# Patient Record
Sex: Male | Born: 1980 | Race: White | Hispanic: No | Marital: Married | Smoking: Former smoker
Health system: Southern US, Community
[De-identification: ages and names within clinical notes are randomized; demographics above are authoritative.]

## PROBLEM LIST (undated history)

## (undated) DIAGNOSIS — F909 Attention-deficit hyperactivity disorder, unspecified type: Secondary | ICD-10-CM

## (undated) DIAGNOSIS — F419 Anxiety disorder, unspecified: Secondary | ICD-10-CM

## (undated) DIAGNOSIS — K219 Gastro-esophageal reflux disease without esophagitis: Secondary | ICD-10-CM

## (undated) DIAGNOSIS — F32A Depression, unspecified: Secondary | ICD-10-CM

## (undated) DIAGNOSIS — Z9109 Other allergy status, other than to drugs and biological substances: Secondary | ICD-10-CM

## (undated) HISTORY — DX: Gastro-esophageal reflux disease without esophagitis: K21.9

## (undated) HISTORY — DX: Depression, unspecified: F32.A

## (undated) HISTORY — PX: OTHER SURGICAL HISTORY: SHX169

## (undated) HISTORY — DX: Other allergy status, other than to drugs and biological substances: Z91.09

## (undated) HISTORY — DX: Anxiety disorder, unspecified: F41.9

---

## 2012-06-12 DIAGNOSIS — F909 Attention-deficit hyperactivity disorder, unspecified type: Secondary | ICD-10-CM

## 2012-06-12 HISTORY — DX: Attention-deficit hyperactivity disorder, unspecified type: F90.9

## 2013-12-26 DIAGNOSIS — K644 Residual hemorrhoidal skin tags: Secondary | ICD-10-CM | POA: Insufficient documentation

## 2013-12-26 DIAGNOSIS — F418 Other specified anxiety disorders: Secondary | ICD-10-CM | POA: Insufficient documentation

## 2013-12-26 DIAGNOSIS — F338 Other recurrent depressive disorders: Secondary | ICD-10-CM | POA: Insufficient documentation

## 2013-12-26 DIAGNOSIS — L649 Androgenic alopecia, unspecified: Secondary | ICD-10-CM | POA: Insufficient documentation

## 2013-12-26 DIAGNOSIS — F439 Reaction to severe stress, unspecified: Secondary | ICD-10-CM | POA: Insufficient documentation

## 2015-04-22 ENCOUNTER — Ambulatory Visit (INDEPENDENT_AMBULATORY_CARE_PROVIDER_SITE_OTHER): Payer: Managed Care, Other (non HMO)

## 2015-04-22 DIAGNOSIS — Z23 Encounter for immunization: Secondary | ICD-10-CM | POA: Diagnosis not present

## 2015-06-16 DIAGNOSIS — Z72820 Sleep deprivation: Secondary | ICD-10-CM | POA: Insufficient documentation

## 2015-06-16 DIAGNOSIS — F5104 Psychophysiologic insomnia: Secondary | ICD-10-CM | POA: Insufficient documentation

## 2015-08-12 DIAGNOSIS — F988 Other specified behavioral and emotional disorders with onset usually occurring in childhood and adolescence: Secondary | ICD-10-CM | POA: Insufficient documentation

## 2016-02-25 ENCOUNTER — Encounter: Payer: Self-pay | Admitting: Emergency Medicine

## 2016-02-25 ENCOUNTER — Ambulatory Visit
Admission: EM | Admit: 2016-02-25 | Discharge: 2016-02-25 | Disposition: A | Discharge status: Home / Self Care | Payer: Worker's Compensation | Attending: Physician Assistant | Admitting: Physician Assistant

## 2016-02-25 ENCOUNTER — Ambulatory Visit (INDEPENDENT_AMBULATORY_CARE_PROVIDER_SITE_OTHER): Payer: Worker's Compensation

## 2016-02-25 DIAGNOSIS — M791 Myalgia: Secondary | ICD-10-CM

## 2016-02-25 DIAGNOSIS — M7918 Myalgia, other site: Secondary | ICD-10-CM

## 2016-02-25 HISTORY — DX: Attention-deficit hyperactivity disorder, unspecified type: F90.9

## 2016-02-25 NOTE — Discharge Instructions (Signed)
CONTINUE SYMPTOMATIC TREATMENT  YOU MAY RETURN TO FULL DUTY WITHOUT RESTRICTIONS.

## 2016-02-25 NOTE — ED Provider Notes (Signed)
CSN: [id number]     Arrival date & time 02/25/16  1031 History   First MD Initiated Contact with Patient 02/25/16 1051     Chief Complaint  Patient presents with  . WORKER's Comp  . Neck Pain   (Consider location/radiation/quality/duration/timing/severity/associated sxs/prior Treatment) HPI History obtained from patient:  Pt presents with the cc of:  Neck pain Duration of symptoms: One month Treatment prior to arrival: Massage and symptomatic treatment. 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 He is having left-sided neck pain. He states that earlier this week his wife gave him a massage and he has some decrease in hearing on the left side. Other symptoms include: Decreased auditory acuity Pain score: 1 or 2 FAMILY HISTORY: None    History reviewed. No pertinent past medical history. Past Surgical History:  Procedure Laterality Date  . dental implant     History reviewed. No pertinent family history. Social History  Substance Use Topics  . Smoking status: Former Games developer  . Smokeless tobacco: Current User  . Alcohol use Yes    Review of Systems  Denies: HEADACHE, NAUSEA, ABDOMINAL PAIN, CHEST PAIN, CONGESTION, DYSURIA, SHORTNESS OF BREATH  Allergies  Review of patient's allergies indicates no known allergies.  Home Medications   Prior to Admission medications   Not on File   Meds Ordered and Administered this Visit  Medications - No data to display  BP 129/87 (BP Location: Right Arm)   Pulse 77   Temp 97.9 F (36.6 C) (Tympanic)   Resp 16   SpO2 100%  No data found.   Physical Exam NURSES NOTES AND VITAL SIGNS REVIEWED. CONSTITUTIONAL: Well developed, well nourished, no acute distress HEENT: normocephalic, atraumatic left TM does have a fluid level noted. No signs of infection. EYES: Conjunctiva normal NECK:normal ROM,  supple, no adenopathy, no midline tenderness. No trapezius spasm. PULMONARY:No respiratory distress, normal effort ABDOMINAL: Soft, ND, NT BS+, No CVAT MUSCULOSKELETAL: Normal ROM of all extremities,  SKIN: warm and dry without rash PSYCHIATRIC: Mood and affect, behavior are normal  Urgent Care Course   Clinical Course    Procedures (including critical care time)  Labs Review Labs Reviewed - No data to display  Imaging Review Dg Cervical Spine Complete  Result Date: 02/25/2016 CLINICAL DATA:  Axial loading injury 1 month ago with left side neck pain radiating into the shoulder. EXAM: CERVICAL SPINE - COMPLETE 4+ VIEW COMPARISON:  None. FINDINGS: There is no evidence of cervical spine fracture or prevertebral soft tissue swelling. Alignment is normal. No other significant bone abnormalities are identified. IMPRESSION: Negative cervical spine radiographs. Electronically Signed   By: Kennith Center M.D.   On: 02/25/2016 11:47   Review of x-rays are done with patient prior to his discharge.  Visual Acuity Review  Right Eye Distance:   Left Eye Distance:   Bilateral Distance:    Right Eye Near:   Left Eye Near:    Bilateral Near:        Suggest continued symptomatic treatment. Patient may return to work and regular duty. No indication at this time that further advanced imaging studies are needed. MDM   1. Musculoskeletal pain     Patient is reassured that there are no issues that require transfer to higher level of care at this time or additional tests. Patient is advised to continue home symptomatic  treatment. Patient is advised that if there are new or worsening symptoms to attend the emergency department, contact primary care provider, or return to UC. Instructions of care provided discharged home in stable condition.    THIS NOTE WAS GENERATED USING A VOICE RECOGNITION SOFTWARE PROGRAM. ALL REASONABLE EFFORTS  WERE MADE TO PROOFREAD THIS DOCUMENT FOR ACCURACY.  I have  verbally reviewed the discharge instructions with the patient. A printed AVS was given to the patient.  All questions were answered prior to discharge.      Tharon AquasFrank C Patrick, PA 02/25/16 1159

## 2016-02-25 NOTE — ED Triage Notes (Signed)
Patient states that a month ago while at work patient was bending down to remove an item from the metal crate and when he stood back up he hit the top of his head and felt a pop in his neck.  Patient c/o left sided neck pain for about a month.  Patient denies LOC and denies dizziness.  Patient states that he occasionally has a buzzing sound in his left ear 2 days ago but does not have it today.

## 2016-08-05 DIAGNOSIS — L700 Acne vulgaris: Secondary | ICD-10-CM | POA: Insufficient documentation

## 2016-08-25 ENCOUNTER — Ambulatory Visit (INDEPENDENT_AMBULATORY_CARE_PROVIDER_SITE_OTHER): Payer: Managed Care, Other (non HMO)

## 2016-08-25 ENCOUNTER — Ambulatory Visit
Admission: EM | Admit: 2016-08-25 | Discharge: 2016-08-25 | Disposition: A | Discharge status: Home / Self Care | Payer: Managed Care, Other (non HMO) | Attending: Family Medicine | Admitting: Family Medicine

## 2016-08-25 ENCOUNTER — Encounter: Payer: Self-pay | Admitting: Emergency Medicine

## 2016-08-25 DIAGNOSIS — R059 Cough, unspecified: Secondary | ICD-10-CM

## 2016-08-25 DIAGNOSIS — R05 Cough: Secondary | ICD-10-CM

## 2016-08-25 MED ORDER — PREDNISONE 20 MG PO TABS: 20.0000 mg | ORAL_TABLET | Freq: Every day | ORAL | 0 refills | Status: DC

## 2016-08-25 MED ORDER — AZITHROMYCIN 250 MG PO TABS: ORAL_TABLET | ORAL | 0 refills | Status: DC

## 2016-08-25 NOTE — ED Triage Notes (Signed)
Patient c/o cough and chest congestion for five weeks.  Patient denies fevers.

## 2016-08-25 NOTE — ED Provider Notes (Signed)
[facility]    CSN: [id number] Arrival date & time: 08/25/16  1600     History   Chief Complaint Chief Complaint  Patient presents with  . Cough    HPI Brett Hunt is a 36 y.o. male.   The history is provided by the patient.  Cough  Associated symptoms: wheezing   Associated symptoms: no headaches   URI  Presenting symptoms: cough   Severity:  Severe Onset quality:  Gradual Duration:  5 weeks Timing:  Constant Progression:  Worsening Chronicity:  New Relieved by:  Nothing Associated symptoms: wheezing   Associated symptoms: no headaches and no sinus pain   Risk factors: recent illness (had flu 5 weeks ago; chronic cough since then)   Risk factors: not elderly, no chronic cardiac disease, no chronic kidney disease, no chronic respiratory disease and no diabetes mellitus     Past Medical History:  Diagnosis Date  . ADHD (attention deficit hyperactivity disorder)     There are no active problems to display for this patient.   Past Surgical History:  Procedure Laterality Date  . dental implant         Home Medications    Prior to Admission medications   Medication Sig Start Date End Date Taking? Authorizing Provider  DOXYCYCLINE HYCLATE PO Take by mouth.   Yes Historical Provider, MD  mirtazapine (REMERON) 15 MG tablet Take 15 mg by mouth at bedtime.   Yes Historical Provider, MD  amitriptyline (ELAVIL) 100 MG tablet Take 100 mg by mouth at bedtime.    Historical Provider, MD  amphetamine-dextroamphetamine (ADDERALL) 15 MG tablet Take 15 mg by mouth 2 (two) times daily.    Historical Provider, MD  azithromycin (ZITHROMAX Z-PAK) 250 MG tablet 2 tabs po once day 1, then 1 tab po qd for next 4 days 08/25/16   Payton Mccallum, MD  finasteride (PROPECIA) 1 MG tablet Take 1 mg by mouth daily.    Historical Provider, MD  predniSONE (DELTASONE) 20 MG tablet Take 1 tablet (20 mg total) by mouth daily. 08/25/16   Payton Mccallum, MD    Family  History History reviewed. No pertinent family history.  Social History Social History  Substance Use Topics  . Smoking status: Former Games developer  . Smokeless tobacco: Current User  . Alcohol use Yes     Allergies   Penicillins   Review of Systems Review of Systems  HENT: Negative for sinus pain.   Respiratory: Positive for cough and wheezing.   Neurological: Negative for headaches.     Physical Exam Triage Vital Signs ED Triage Vitals  Enc Vitals Group     BP 08/25/16 1621 132/88     Pulse Rate 08/25/16 1621 92     Resp 08/25/16 1621 16     Temp 08/25/16 1621 98.7 F (37.1 C)     Temp Source 08/25/16 1621 Oral     SpO2 08/25/16 1621 100 %     Weight 08/25/16 1618 210 lb (95.3 kg)     Height 08/25/16 1618 6\' 1"  (1.854 m)     Head Circumference --      Peak Flow --      Pain Score 08/25/16 1620 0     Pain Loc --      Pain Edu? --      Excl. in GC? --    No data found.   Updated Vital Signs BP 132/88 (BP Location: Left Arm)   Pulse 92   Temp 98.7 F (  37.1 C) (Oral)   Resp 16   Ht 6\' 1"  (1.854 m)   Wt 210 lb (95.3 kg)   SpO2 100%   BMI 27.71 kg/m   Visual Acuity Right Eye Distance:   Left Eye Distance:   Bilateral Distance:    Right Eye Near:   Left Eye Near:    Bilateral Near:     Physical Exam  Constitutional: He appears well-developed and well-nourished. No distress.  HENT:  Head: Normocephalic and atraumatic.  Right Ear: Tympanic membrane, external ear and ear canal normal.  Left Ear: Tympanic membrane, external ear and ear canal normal.  Nose: Nose normal.  Mouth/Throat: Uvula is midline, oropharynx is clear and moist and mucous membranes are normal. No oropharyngeal exudate or tonsillar abscesses.  Eyes: Conjunctivae and EOM are normal. Pupils are equal, round, and reactive to light. Right eye exhibits no discharge. Left eye exhibits no discharge. No scleral icterus.  Neck: Normal range of motion. Neck supple. No tracheal deviation present.  No thyromegaly present.  Cardiovascular: Normal rate, regular rhythm and normal heart sounds.   Pulmonary/Chest: Effort normal. No stridor. No respiratory distress. He has wheezes (few, mild, expiratory ). He has no rales. He exhibits no tenderness.  Lymphadenopathy:    He has no cervical adenopathy.  Neurological: He is alert.  Skin: Skin is warm and dry. No rash noted. He is not diaphoretic.  Nursing note and vitals reviewed.    UC Treatments / Results  Labs (all labs ordered are listed, but only abnormal results are displayed) Labs Reviewed - No data to display  EKG  EKG Interpretation None       Radiology Dg Chest 2 View  Result Date: 08/25/2016 CLINICAL DATA:  Cough x6 weeks, flu-like illness EXAM: CHEST  2 VIEW COMPARISON:  None. FINDINGS: Lungs are clear.  No pleural effusion or pneumothorax. The heart is normal in size. Visualized osseous structures are within normal limits. IMPRESSION: Normal chest radiographs. Electronically Signed   By: Charline BillsSriyesh  Krishnan M.D.   On: 08/25/2016 16:44    Procedures Procedures (including critical care time)  Medications Ordered in UC Medications - No data to display   Initial Impression / Assessment and Plan / UC Course  I have reviewed the triage vital signs and the nursing notes.  Pertinent labs & imaging results that were available during my care of the patient were reviewed by me and considered in my medical decision making (see chart for details).       Final Clinical Impressions(s) / UC Diagnoses   Final diagnoses:  Cough    New Prescriptions Discharge Medication List as of 08/25/2016  4:56 PM    START taking these medications   Details  azithromycin (ZITHROMAX Z-PAK) 250 MG tablet 2 tabs po once day 1, then 1 tab po qd for next 4 days, Normal    predniSONE (DELTASONE) 20 MG tablet Take 1 tablet (20 mg total) by mouth daily., Starting Fri 08/25/2016, Normal       1. x-ray results and diagnosis reviewed with  patient 2. rx as per orders above; reviewed possible side effects, interactions, risks and benefits  3. Recommend supportive treatment with increased fluids 4. Follow-up prn if symptoms worsen or don't improve   Payton Mccallumrlando Jovian Lembcke, MD 08/25/16 [id number]

## 2017-02-16 ENCOUNTER — Encounter: Payer: Self-pay | Admitting: Emergency Medicine

## 2017-02-16 ENCOUNTER — Ambulatory Visit
Admission: EM | Admit: 2017-02-16 | Discharge: 2017-02-16 | Disposition: A | Discharge status: Home / Self Care | Payer: Managed Care, Other (non HMO)

## 2017-02-16 DIAGNOSIS — R059 Cough, unspecified: Secondary | ICD-10-CM

## 2017-02-16 DIAGNOSIS — R05 Cough: Secondary | ICD-10-CM | POA: Diagnosis not present

## 2017-02-16 DIAGNOSIS — J069 Acute upper respiratory infection, unspecified: Secondary | ICD-10-CM | POA: Diagnosis not present

## 2017-02-16 MED ORDER — HYDROCOD POLST-CPM POLST ER 10-8 MG/5ML PO SUER: 5.0000 mL | Freq: Two times a day (BID) | ORAL | 0 refills | Status: DC

## 2017-02-16 MED ORDER — AZITHROMYCIN 250 MG PO TABS: ORAL_TABLET | ORAL | 0 refills | Status: DC

## 2017-02-16 MED ORDER — BENZONATATE 200 MG PO CAPS: ORAL_CAPSULE | ORAL | 0 refills | Status: DC

## 2017-02-16 NOTE — ED Triage Notes (Signed)
Patient c/o cough, chest congestion and HAs since Wed.

## 2017-02-16 NOTE — ED Provider Notes (Signed)
[facility]    CSN: [id number] Arrival date & time: 02/16/17  [id number]     History   Chief Complaint Chief Complaint  Patient presents with  . Cough    HPI Brett Hunt is a 36 y.o. male.   HPI  A 36 year old male who presents with 8 days of a productive cough , congestion and headaches. He's had no fever or chills. He states that the cough is producing yellow and green sputum. He states he is coughing at nighttime and is interfering with his sleep. He is a former smoker but hasn't smoked in years.         Past Medical History:  Diagnosis Date  . ADHD (attention deficit hyperactivity disorder)     There are no active problems to display for this patient.   Past Surgical History:  Procedure Laterality Date  . dental implant         Home Medications    Prior to Admission medications   Medication Sig Start Date End Date Taking? Authorizing Provider  QUEtiapine (SEROQUEL) 50 MG tablet Take 50 mg by mouth at bedtime.   Yes [provider]  amphetamine-dextroamphetamine (ADDERALL) 15 MG tablet Take 15 mg by mouth 2 (two) times daily.    [provider]  azithromycin (ZITHROMAX Z-PAK) 250 MG tablet Use as per package instructions 02/16/17   Lutricia Feiloemer, Dericka Ostenson P, PA-C  benzonatate (TESSALON) 200 MG capsule Take one cap TID PRN cough 02/16/17   Lutricia Feiloemer, Elya Diloreto P, PA-C  chlorpheniramine-HYDROcodone (TUSSIONEX PENNKINETIC ER) 10-8 MG/5ML SUER Take 5 mLs by mouth 2 (two) times daily. 02/16/17   Lutricia Feiloemer, Ellamarie Naeve P, PA-C  finasteride (PROPECIA) 1 MG tablet Take 1 mg by mouth daily.    [provider]    Family History History reviewed. No pertinent family history.  Social History Social History  Substance Use Topics  . Smoking status: Former Games developermoker  . Smokeless tobacco: Current User  . Alcohol use Yes     Allergies   Penicillins   Review of Systems Review of Systems  Constitutional: Positive for activity change. Negative for  appetite change, chills, fatigue and fever.  HENT: Positive for congestion and sore throat.   Respiratory: Positive for cough. Negative for shortness of breath, wheezing and stridor.   All other systems reviewed and are negative.    Physical Exam Triage Vital Signs ED Triage Vitals  Enc Vitals Group     BP 02/16/17 0828 125/77     Pulse Rate 02/16/17 0828 88     Resp 02/16/17 0828 16     Temp 02/16/17 0828 98.2 F (36.8 C)     Temp Source 02/16/17 0828 Oral     SpO2 02/16/17 0828 99 %     Weight 02/16/17 0824 210 lb (95.3 kg)     Height 02/16/17 0824 6\' 1"  (1.854 m)     Head Circumference --      Peak Flow --      Pain Score 02/16/17 0824 2     Pain Loc --      Pain Edu? --      Excl. in GC? --    No data found.   Updated Vital Signs BP 125/77 (BP Location: Right Arm)   Pulse 88   Temp 98.2 F (36.8 C) (Oral)   Resp 16   Ht 6\' 1"  (1.854 m)   Wt 210 lb (95.3 kg)   SpO2 99%   BMI 27.71 kg/m   Visual Acuity Right  Eye Distance:   Left Eye Distance:   Bilateral Distance:    Right Eye Near:   Left Eye Near:    Bilateral Near:     Physical Exam  Constitutional: He is oriented to person, place, and time. He appears well-developed and well-nourished. No distress.  HENT:  Head: Normocephalic.  Right Ear: External ear normal.  Left Ear: External ear normal.  Nose: Nose normal.  Mouth/Throat: Oropharynx is clear and moist. No oropharyngeal exudate.  Eyes: Pupils are equal, round, and reactive to light.  Neck: Normal range of motion.  Pulmonary/Chest: Effort normal and breath sounds normal. No respiratory distress. He has no wheezes. He has no rales.  Musculoskeletal: Normal range of motion.  Lymphadenopathy:    He has no cervical adenopathy.  Neurological: He is alert and oriented to person, place, and time.  Skin: Skin is warm and dry. He is not diaphoretic.  Psychiatric: He has a normal mood and affect. His behavior is normal. Judgment and thought content  normal.  Nursing note and vitals reviewed.    UC Treatments / Results  Labs (all labs ordered are listed, but only abnormal results are displayed) Labs Reviewed - No data to display  EKG  EKG Interpretation None       Radiology No results found.  Procedures Procedures (including critical care time)  Medications Ordered in UC Medications - No data to display   Initial Impression / Assessment and Plan / UC Course  I have reviewed the triage vital signs and the nursing notes.  Pertinent labs & imaging results that were available during my care of the patient were reviewed by me and considered in my medical decision making (see chart for details).     Plan: 1. Test/x-ray results and diagnosis reviewed with patient 2. rx as per orders; risks, benefits, potential side effects reviewed with patient 3. Recommend supportive treatment with rest and fluids. I have given him a prescription for azithromycin but have asked him to not use it for at least 5-7 days to see if the coughing will clear on its own. He is cautioned regarding the use of the Tussionex with driving or with activities, requiring concentration judgment. I have recommended he use the Tessalon Perles during the daytime. If he is not improving he should follow-up with his primary care physician Dr. Mariana Kaufman. 4. F/u prn if symptoms worsen or don't improve   Final Clinical Impressions(s) / UC Diagnoses   Final diagnoses:  Cough  Acute upper respiratory infection    New Prescriptions Discharge Medication List as of 02/16/2017  9:03 AM    START taking these medications   Details  azithromycin (ZITHROMAX Z-PAK) 250 MG tablet Use as per package instructions, Normal    benzonatate (TESSALON) 200 MG capsule Take one cap TID PRN cough, Normal    chlorpheniramine-HYDROcodone (TUSSIONEX PENNKINETIC ER) 10-8 MG/5ML SUER Take 5 mLs by mouth 2 (two) times daily., Starting Fri 02/16/2017, Print         Controlled Substance  Prescriptions [place] Controlled Substance Registry consulted? Not Applicable   Lutricia Feil, PA-C 02/16/17 0454

## 2017-06-12 HISTORY — PX: OTHER SURGICAL HISTORY: SHX169

## 2017-06-22 IMAGING — CR DG CERVICAL SPINE COMPLETE 4+V
7 series · 8 of 8 positions shown · non-contrast
Comparison: None.

CLINICAL DATA: Axial loading injury 1 month ago with left side neck
pain radiating into the shoulder.

EXAM:
CERVICAL SPINE - COMPLETE 4+ VIEW

[c-spine lat]
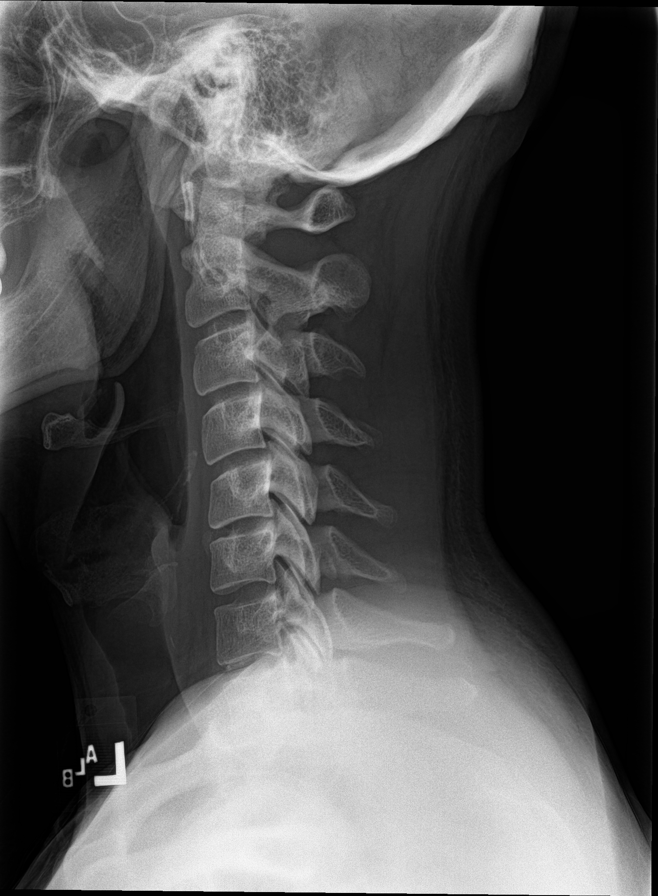

[c-spine obl (1 of 2)]
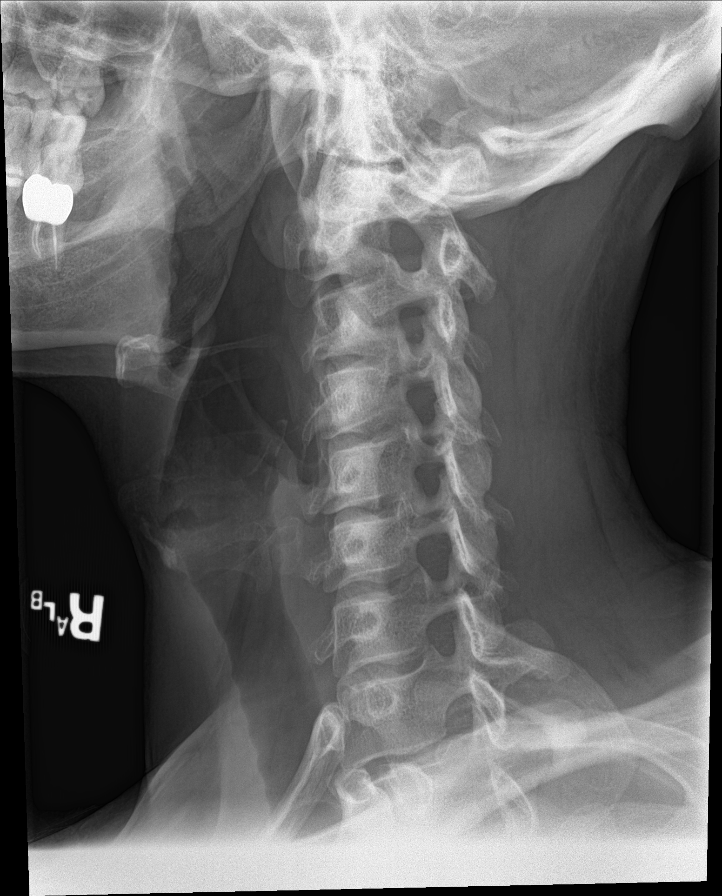

[c-spine obl (2 of 2)]
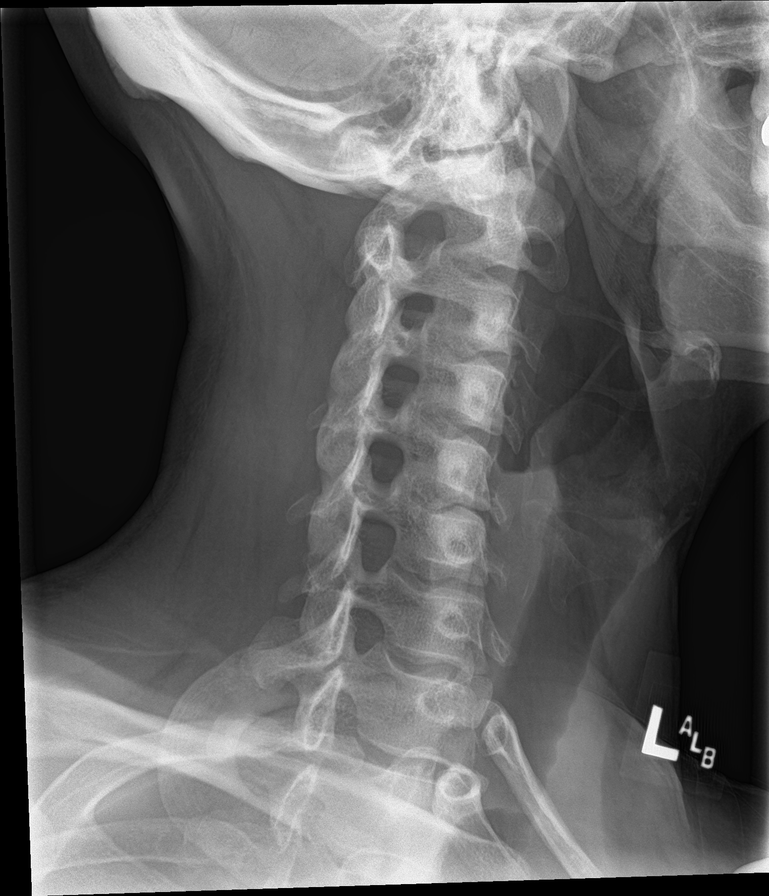

[c-spine ap]
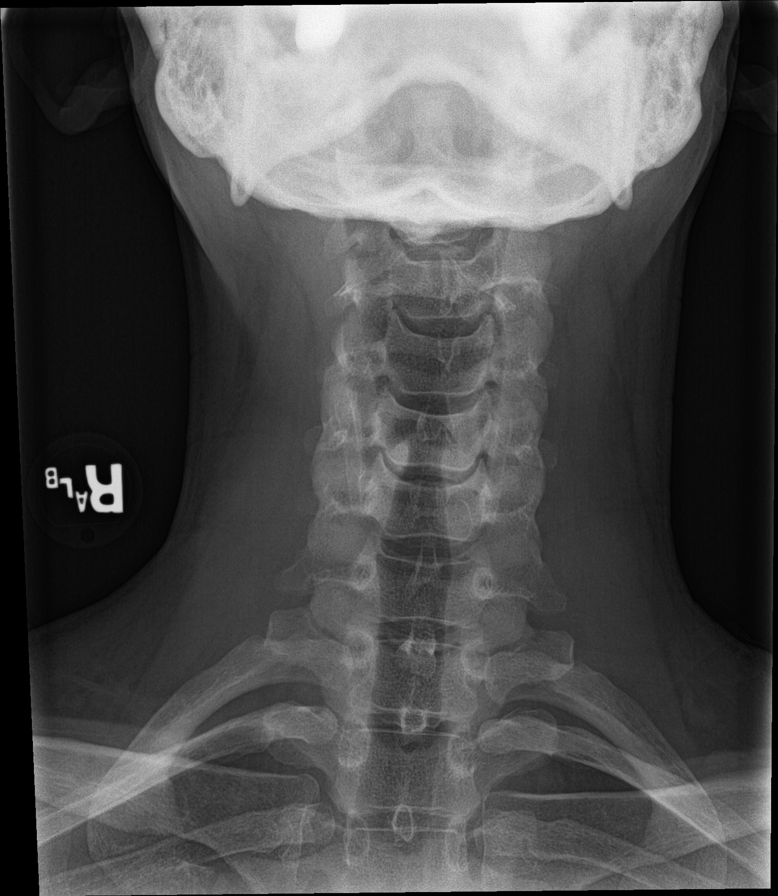

[c-spine open mouth]
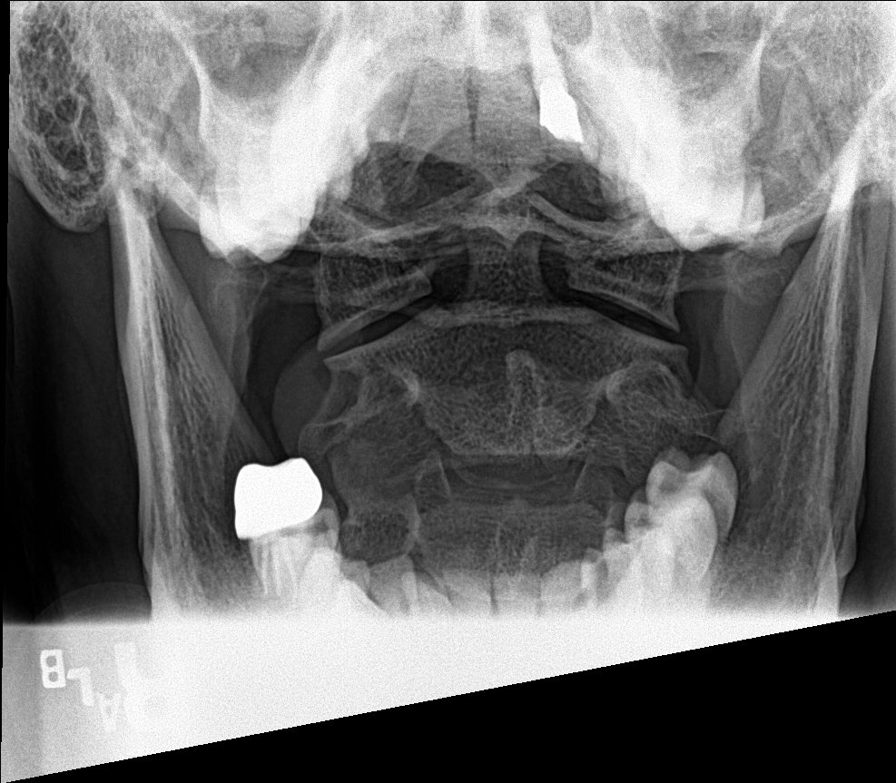

[Series 6: (person_name) · 0.14mm/px · 2 of 2 slices shown]
[im 1/2]
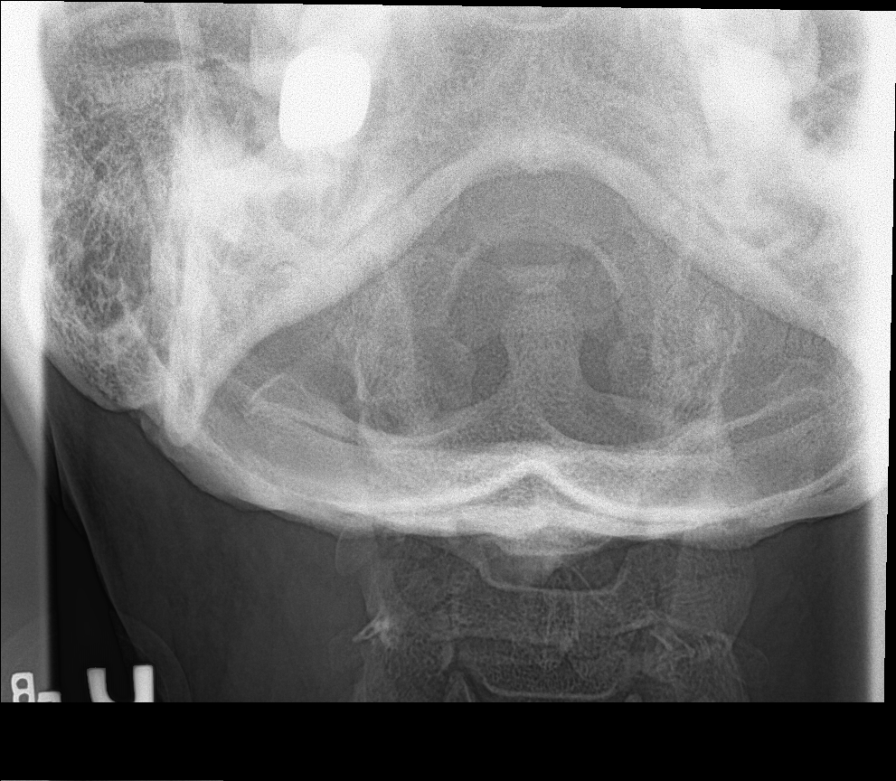
[im 2/2]
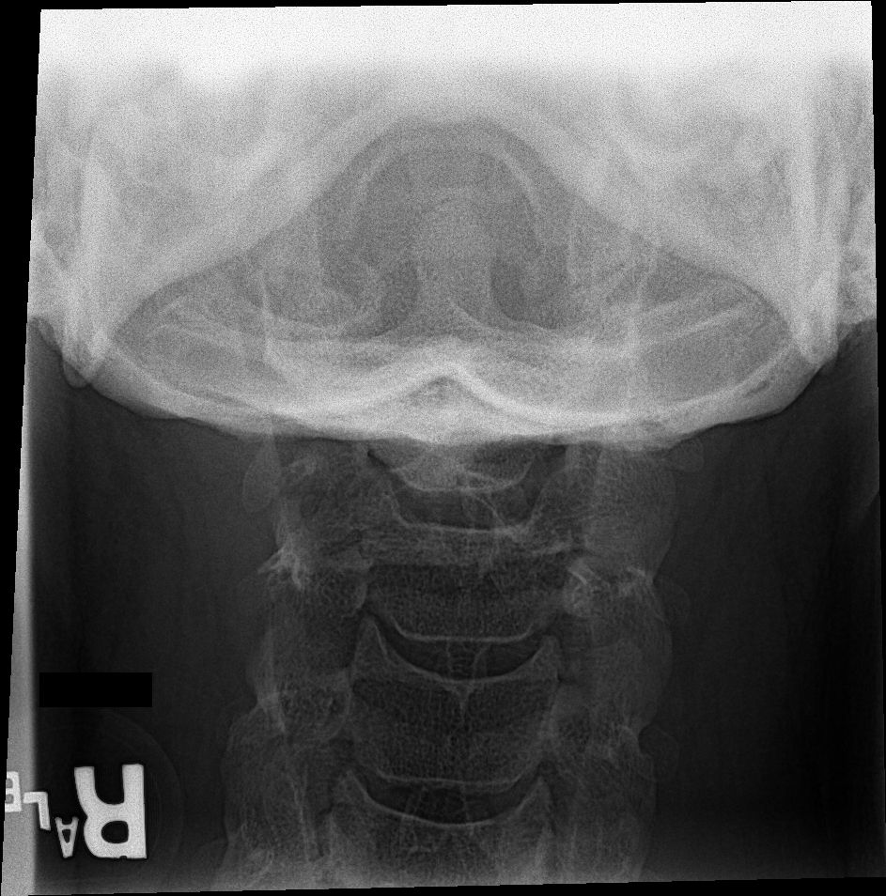

[ct-spine swimmers]
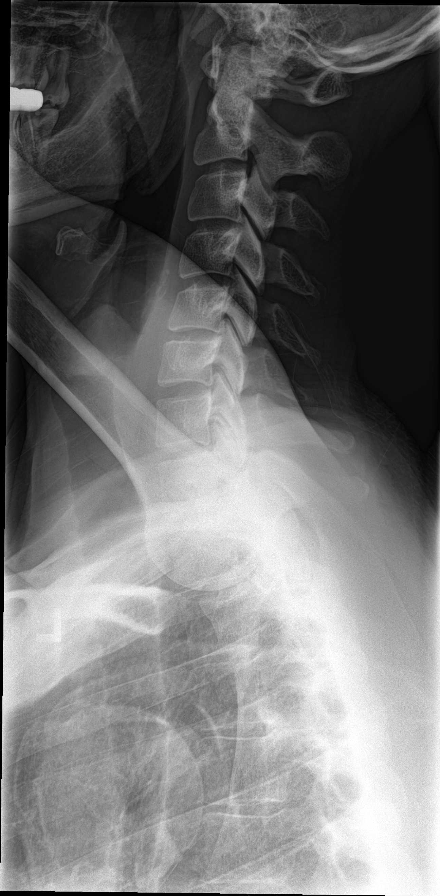

[8 of 8 positions shown; findings below may reference images not displayed]

FINDINGS: There is no evidence of cervical spine fracture or prevertebral soft
tissue swelling. Alignment is normal. No other significant bone
abnormalities are identified.
IMPRESSION: Negative cervical spine radiographs.

## 2017-07-30 ENCOUNTER — Ambulatory Visit
Admission: EM | Admit: 2017-07-30 | Discharge: 2017-07-30 | Disposition: A | Discharge status: Home / Self Care | Payer: Managed Care, Other (non HMO) | Attending: Family Medicine | Admitting: Family Medicine

## 2017-07-30 DIAGNOSIS — W260XXA Contact with knife, initial encounter: Secondary | ICD-10-CM

## 2017-07-30 DIAGNOSIS — Z23 Encounter for immunization: Secondary | ICD-10-CM

## 2017-07-30 DIAGNOSIS — S61211A Laceration without foreign body of left index finger without damage to nail, initial encounter: Secondary | ICD-10-CM

## 2017-07-30 MED ORDER — TETANUS-DIPHTH-ACELL PERTUSSIS 5-2.5-18.5 LF-MCG/0.5 IM SUSP
0.5000 mL | Freq: Once | INTRAMUSCULAR | Status: AC
  Administered 2017-07-30: 0.5 mL via INTRAMUSCULAR

## 2017-07-30 MED ORDER — LIDOCAINE-EPINEPHRINE-TETRACAINE (LET) SOLUTION
3.0000 mL | Freq: Once | NASAL | Status: AC
  Administered 2017-07-30: 15:00:00 3 mL via TOPICAL

## 2017-07-30 NOTE — ED Provider Notes (Signed)
[facility]    CSN: [id number] Arrival date & time: 07/30/17  1402  History   Chief Complaint Chief Complaint  Patient presents with  . Extremity Laceration   HPI  37 year old male presents with a laceration.  Patient states that he was using a box cutter earlier today.  He states that it was a clean blade.  He was cutting a box and inadvertently cut the tip of his left index finger.  He has had trouble controlling the bleeding.  Given its appearance and continued bleeding, he decided to come in for evaluation.  Sensation intact.  Bleeding stabilized currently with pressure.  He is unsure of his last tetanus.  He states it was a clean wound.  No contamination.  No other symptoms.  No other complaints or concerns at this time.  Past Medical History:  Diagnosis Date  . ADHD (attention deficit hyperactivity disorder)    Past Surgical History:  Procedure Laterality Date  . dental implant     Home Medications    Prior to Admission medications   Medication Sig Start Date End Date Taking? Authorizing Provider  amphetamine-dextroamphetamine (ADDERALL) 15 MG tablet Take 15 mg by mouth 2 (two) times daily.   Yes [provider]  finasteride (PROPECIA) 1 MG tablet Take 1 mg by mouth daily.   Yes [provider]  QUEtiapine (SEROQUEL) 50 MG tablet Take 50 mg by mouth at bedtime.   Yes [provider]  azithromycin (ZITHROMAX Z-PAK) 250 MG tablet Use as per package instructions 02/16/17   Lutricia Feiloemer, William P, PA-C  benzonatate (TESSALON) 200 MG capsule Take one cap TID PRN cough 02/16/17   Lutricia Feiloemer, William P, PA-C  chlorpheniramine-HYDROcodone (TUSSIONEX PENNKINETIC ER) 10-8 MG/5ML SUER Take 5 mLs by mouth 2 (two) times daily. 02/16/17   Lutricia Feiloemer, William P, PA-C    Family History No family history on file.  Social History Social History   Tobacco Use  . Smoking status: Former Games developermoker  . Smokeless tobacco: Current User  Substance Use Topics  . Alcohol  use: Yes  . Drug use: No     Allergies   Penicillins   Review of Systems Review of Systems  Constitutional: Negative.   Skin: Positive for wound.   Physical Exam Triage Vital Signs ED Triage Vitals  Enc Vitals Group     BP 07/30/17 1412 133/84     Pulse Rate 07/30/17 1412 89     Resp 07/30/17 1412 18     Temp 07/30/17 1412 98.4 F (36.9 C)     Temp Source 07/30/17 1412 Oral     SpO2 07/30/17 1412 98 %     Weight 07/30/17 1414 215 lb (97.5 kg)     Height --      Head Circumference --      Peak Flow --      Pain Score 07/30/17 1414 0     Pain Loc --      Pain Edu? --      Excl. in GC? --    Updated Vital Signs BP 133/84 (BP Location: Left Arm)   Pulse 89   Temp 98.4 F (36.9 C) (Oral)   Resp 18   Wt 215 lb (97.5 kg)   SpO2 98%   BMI 28.37 kg/m    Physical Exam  Constitutional: He is oriented to person, place, and time. He appears well-developed. No distress.  HENT:  Head: Normocephalic and atraumatic.  Pulmonary/Chest: Effort normal. No respiratory distress.  Neurological: He  is alert and oriented to person, place, and time.  Skin:  Left index finger -approximately 1 cm curvilinear laceration noted on the radial side of the index finger.  Neurovascular intact distally.  Psychiatric: He has a normal mood and affect. His behavior is normal.  Nursing note and vitals reviewed.  UC Treatments / Results  Labs (all labs ordered are listed, but only abnormal results are displayed) Labs Reviewed - No data to display  EKG  EKG Interpretation None       Radiology No results found.  Procedures Laceration Repair Date/Time: 07/30/2017 3:23 PM Performed by: Tommie Sams, DO Authorized by: Tommie Sams, DO   Consent:    Consent obtained:  Verbal   Consent given by:  Patient Anesthesia (see MAR for exact dosages):    Anesthesia method:  Topical application and local infiltration   Topical anesthetic:  LET   Local anesthetic:  Lidocaine 1% w/o  epi Laceration details:    Location:  Finger   Finger location:  L index finger   Length (cm):  1 Repair type:    Repair type:  Simple Pre-procedure details:    Preparation:  Patient was prepped and draped in usual sterile fashion Exploration:    Hemostasis achieved with:  LET, direct pressure and tourniquet   Contaminated: no   Treatment:    Area cleansed with:  Betadine and soap and water   Amount of cleaning:  Standard Skin repair:    Repair method:  Sutures   Suture size:  6-0   Suture material:  Prolene Approximation:    Approximation:  Close   Vermilion border: well-aligned   Post-procedure details:    Dressing:  Antibiotic ointment (Non stick w/ coban.)   Patient tolerance of procedure:  Tolerated well, no immediate complications   (including critical care time)  Medications Ordered in UC Medications  Tdap (BOOSTRIX) injection 0.5 mL (0.5 mLs Intramuscular Given 07/30/17 1438)  lidocaine-EPINEPHrine-tetracaine (LET) solution (3 mLs Topical Given 07/30/17 1439)     Initial Impression / Assessment and Plan / UC Course  I have reviewed the triage vital signs and the nursing notes.  Pertinent labs & imaging results that were available during my care of the patient were reviewed by me and considered in my medical decision making (see chart for details).     37 year old male presents with a laceration.  Tetanus vaccination given today.  Laceration repaired as above.  Sutures out in 7 days  Final Clinical Impressions(s) / UC Diagnoses   Final diagnoses:  Laceration of left index finger without foreign body without damage to nail, initial encounter    ED Discharge Orders    None     Controlled Substance Prescriptions [place] Controlled Substance Registry consulted? Not Applicable   Tommie Sams, DO 07/30/17 1525

## 2017-07-30 NOTE — Discharge Instructions (Signed)
Keep wound clean.  Just soap and water.  Sutures out in 7 days.  Take care  Dr. Adriana Simasook

## 2017-07-30 NOTE — ED Triage Notes (Signed)
Pt was using his box cutter and sliced his left index finger about 30 minutes ago. States it was a brand new blade so it shouldn't be rusty. States he couldn't get it to stop so came here. Unsure of last tetanus.

## 2017-12-21 IMAGING — CR DG CHEST 2V
2 series · 2 of 2 positions shown · non-contrast
Comparison: None.

CLINICAL DATA: Cough x6 weeks, flu-like illness

EXAM:
CHEST  2 VIEW

[chest pa]
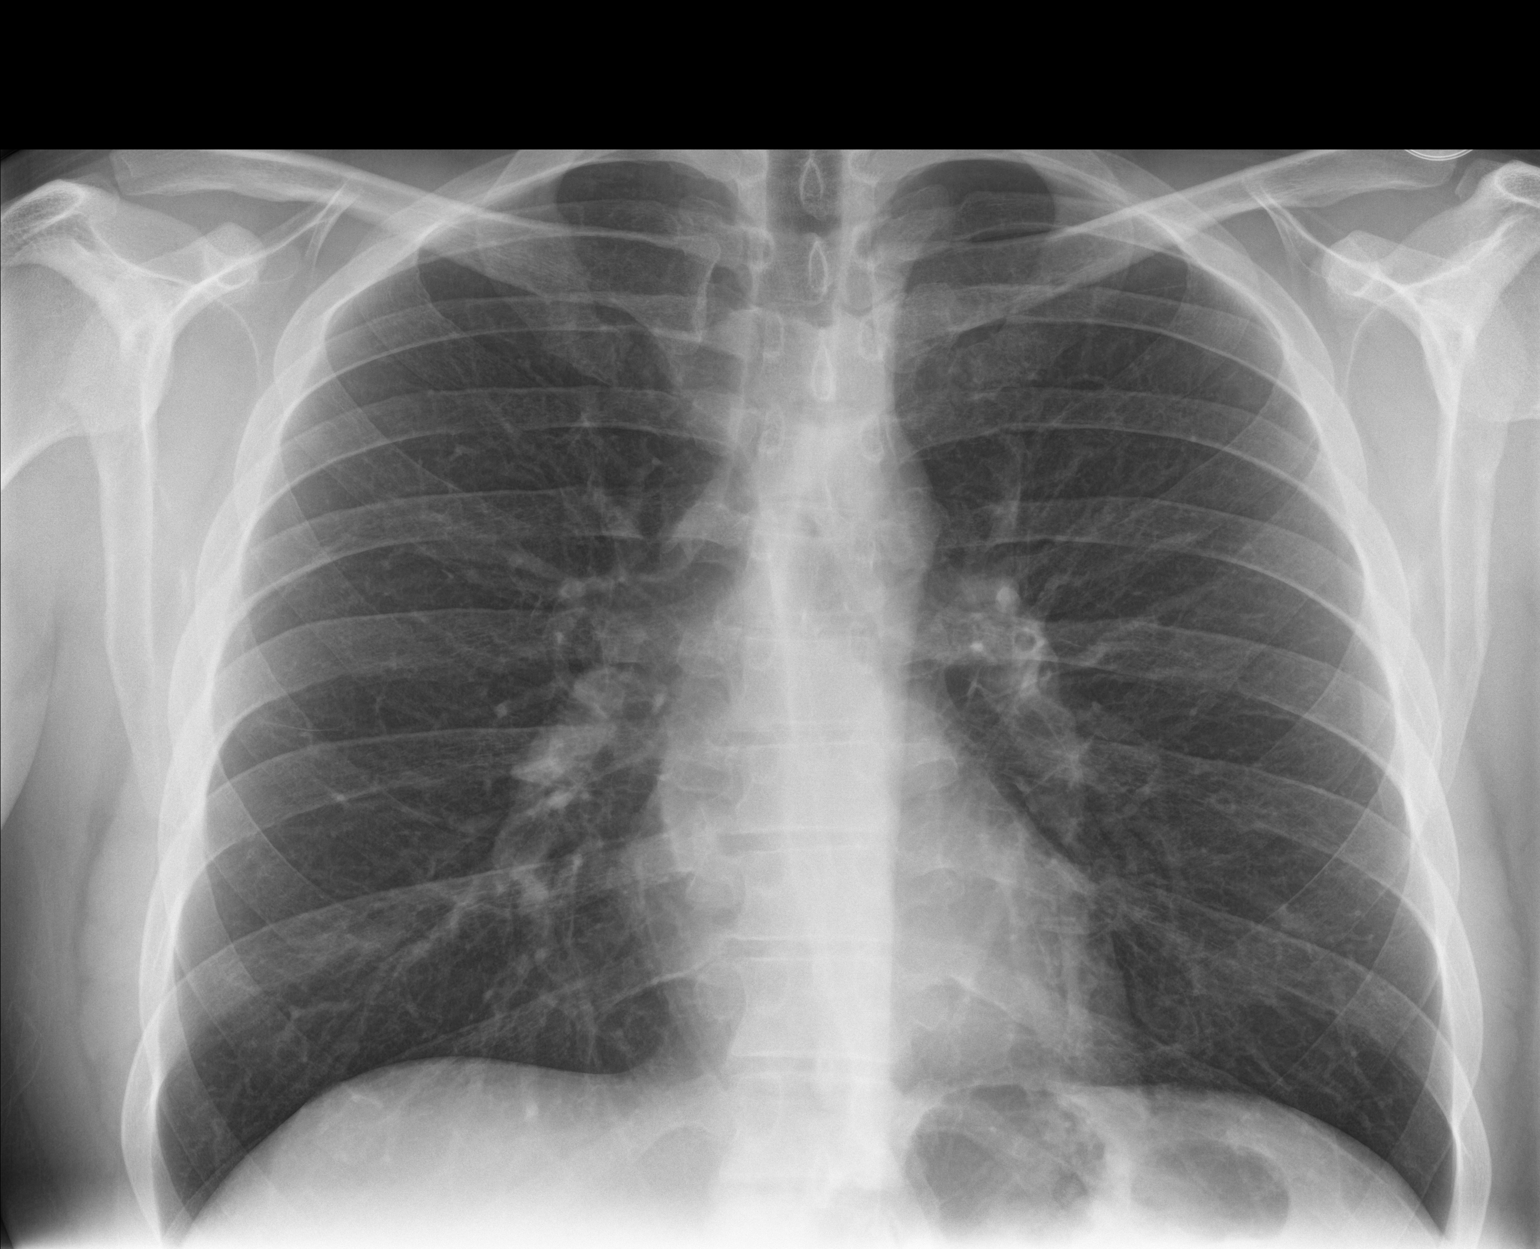

[chest lat]
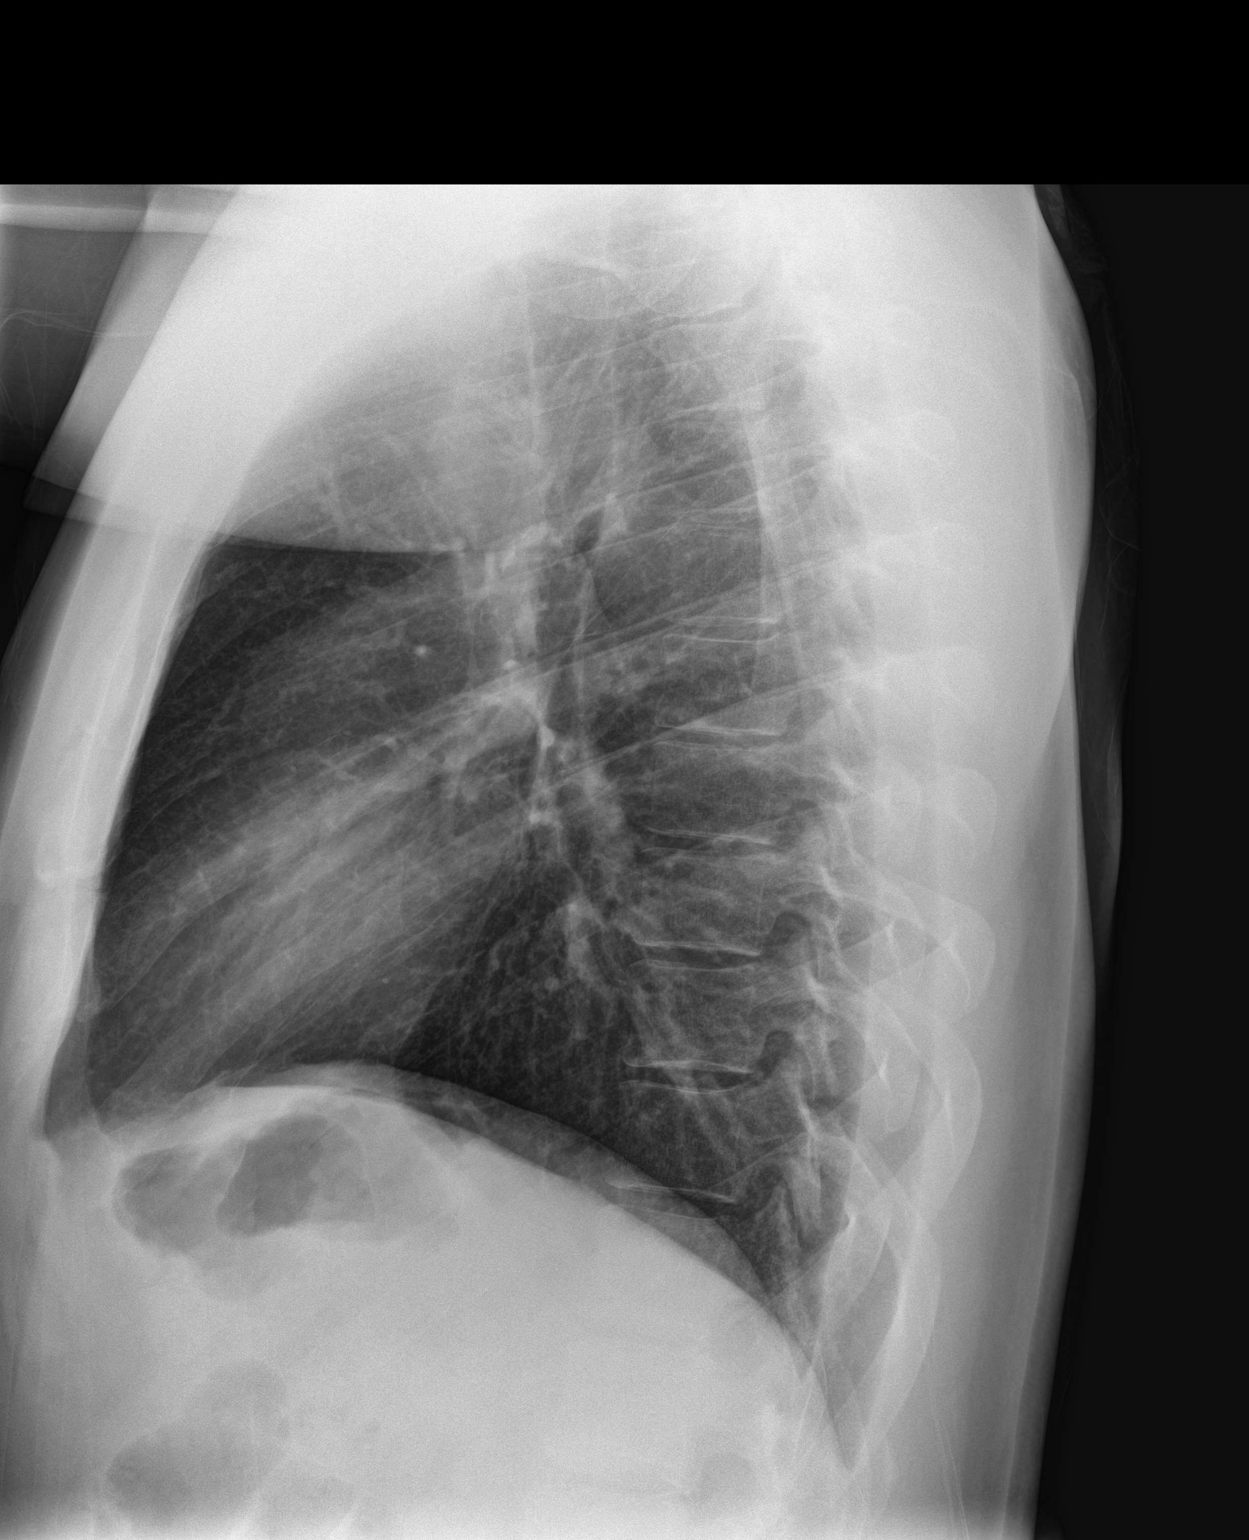

[2 of 2 positions shown; findings below may reference images not displayed]

FINDINGS: Lungs are clear.  No pleural effusion or pneumothorax.

The heart is normal in size.

Visualized osseous structures are within normal limits.
IMPRESSION: Normal chest radiographs.

## 2019-12-12 ENCOUNTER — Other Ambulatory Visit: Payer: Self-pay

## 2019-12-12 ENCOUNTER — Ambulatory Visit
Admission: EM | Admit: 2019-12-12 | Discharge: 2019-12-12 | Disposition: A | Discharge status: Home / Self Care | Payer: Managed Care, Other (non HMO) | Attending: Family Medicine | Admitting: Family Medicine

## 2019-12-12 ENCOUNTER — Encounter: Payer: Self-pay | Admitting: Emergency Medicine

## 2019-12-12 DIAGNOSIS — L03116 Cellulitis of left lower limb: Secondary | ICD-10-CM

## 2019-12-12 DIAGNOSIS — S80862A Insect bite (nonvenomous), left lower leg, initial encounter: Secondary | ICD-10-CM

## 2019-12-12 DIAGNOSIS — W57XXXA Bitten or stung by nonvenomous insect and other nonvenomous arthropods, initial encounter: Secondary | ICD-10-CM

## 2019-12-12 MED ORDER — DOXYCYCLINE HYCLATE 100 MG PO CAPS: 100.0000 mg | ORAL_CAPSULE | Freq: Two times a day (BID) | ORAL | 0 refills | Status: DC

## 2019-12-12 MED ORDER — TRIAMCINOLONE ACETONIDE 0.1 % EX CREA: 1.0000 "application " | TOPICAL_CREAM | Freq: Two times a day (BID) | CUTANEOUS | 0 refills | Status: DC

## 2019-12-12 NOTE — ED Provider Notes (Signed)
[facility]   [id number] 12/12/19 Arrival Time: 1322  CC: RASH  SUBJECTIVE:  Brett Hunt is a 39 y.o. male who presents with a skin complaint that began 4 days ago.  Reports that he thinks that he was bitten either by a tick or by a spider.  He reports that he had a lot of tick bites this year.  Denies medications change or starting a new medication recently.  Localizes the rash to the back of the left knee describes the area as red, itchy, warm to the touch, swollen, painful.  There are no aggravating or alleviating factors.  Denies bull's-eye shaped rash.  Denies fever, chills, nausea, vomiting, erythema, swelling, discharge, oral lesions, SOB, chest pain, abdominal pain, changes in bowel or bladder function.    ROS: As per HPI.  All other pertinent ROS negative.     Past Medical History:  Diagnosis Date   ADHD (attention deficit hyperactivity disorder)    Past Surgical History:  Procedure Laterality Date   dental implant     Allergies  Allergen Reactions   Penicillins Other (See Comments)    Unknown   No current facility-administered medications on file prior to encounter.   Current Outpatient Medications on File Prior to Encounter  Medication Sig Dispense Refill   amphetamine-dextroamphetamine (ADDERALL) 15 MG tablet Take 15 mg by mouth 2 (two) times daily.     azithromycin (ZITHROMAX Z-PAK) 250 MG tablet Use as per package instructions 1 each 0   benzonatate (TESSALON) 200 MG capsule Take one cap TID PRN cough 21 capsule 0   chlorpheniramine-HYDROcodone (TUSSIONEX PENNKINETIC ER) 10-8 MG/5ML SUER Take 5 mLs by mouth 2 (two) times daily. 115 mL 0   finasteride (PROPECIA) 1 MG tablet Take 1 mg by mouth daily.     QUEtiapine (SEROQUEL) 50 MG tablet Take 50 mg by mouth at bedtime.     Social History   Socioeconomic History   Marital status: Married    Spouse name: Not on file   Number of children: Not on file   Years of education: Not on file    Highest education level: Not on file  Occupational History   Not on file  Tobacco Use   Smoking status: Former Smoker   Smokeless tobacco: Current User  Vaping Use   Vaping Use: Never used  Substance and Sexual Activity   Alcohol use: Yes   Drug use: No   Sexual activity: Not on file  Other Topics Concern   Not on file  Social History Narrative   Not on file   Social Determinants of Health   Financial Resource Strain:    Difficulty of Paying Living Expenses:   Food Insecurity:    Worried About Programme researcher, broadcasting/film/video in the Last Year:    Barista in the Last Year:   Transportation Needs:    Freight forwarder (Medical):    Lack of Transportation (Non-Medical):   Physical Activity:    Days of Exercise per Week:    Minutes of Exercise per Session:   Stress:    Feeling of Stress :   Social Connections:    Frequency of Communication with Friends and Family:    Frequency of Social Gatherings with Friends and Family:    Attends Religious Services:    Active Member of Clubs or Organizations:    Attends Banker Meetings:    Marital Status:   Intimate Partner Violence:    Fear of Current or  Ex-Partner:    Emotionally Abused:    Physically Abused:    Sexually Abused:    Family History  Problem Relation Age of Onset   Healthy Mother    Heart disease Father     OBJECTIVE: Vitals:   12/12/19 1335 12/12/19 1338  BP:  120/75  Pulse:  79  Resp:  16  Temp:  97.8 F (36.6 C)  TempSrc:  Oral  SpO2:  100%  Weight: 225 lb (102.1 kg)   Height: 6\' 1"  (1.854 m)     General appearance: alert; no distress Head: NCAT Lungs: clear to auscultation bilaterally Heart: regular rate and rhythm.  Radial pulse 2+ bilaterally Extremities: no edema Skin: warm and dry;    Psychological: alert and cooperative; normal mood and affect  ASSESSMENT & PLAN:  1. Cellulitis of left lower extremity   2. Insect bite of left lower leg,  initial encounter     Meds ordered this encounter  Medications   doxycycline (VIBRAMYCIN) 100 MG capsule    Sig: Take 1 capsule (100 mg total) by mouth 2 (two) times daily.    Dispense:  14 capsule    Refill:  0    Order Specific Question:   Supervising Provider    Answer:   Merrilee Jansky   triamcinolone cream (KENALOG) 0.1 %    Sig: Apply 1 application topically 2 (two) times daily.    Dispense:  30 g    Refill:  0    Order Specific Question:   Supervising Provider    Answer:   [id number] Merrilee Jansky    Triamcinolone 0.1% (corticosteroid - itch/ inflammation relief) Prescribe doxycycline Take as prescribed and to completion Avoid hot showers/ baths Moisturize skin daily  Follow up with PCP if symptoms persists Return or go to the ER if you have any new or worsening symptoms such as fever, chills, nausea, vomiting, redness, swelling, discharge, if symptoms do not improve with medications  Reviewed expectations re: course of current medical issues. Questions answered. Outlined signs and symptoms indicating need for more acute intervention. Patient verbalized understanding. After Visit Summary given.   [[id number]], NP 12/12/19 1410

## 2019-12-12 NOTE — Discharge Instructions (Addendum)
I sent doxycycline to your pharmacy.  Also sent triamcinolone cream for you to use for the itching  Take this twice a day for 7 days  You may apply ice to the area to help decrease swelling

## 2019-12-12 NOTE — ED Triage Notes (Signed)
Patient c/o possible insect bite to the back of his left leg since Tuesday.  Patient c/o redness, swelling and itching at the site.

## 2021-02-05 ENCOUNTER — Ambulatory Visit
Admission: EM | Admit: 2021-02-05 | Discharge: 2021-02-05 | Disposition: A | Discharge status: Home / Self Care | Payer: Managed Care, Other (non HMO)

## 2021-02-05 ENCOUNTER — Other Ambulatory Visit: Payer: Self-pay

## 2021-02-05 ENCOUNTER — Encounter: Payer: Self-pay | Admitting: Emergency Medicine

## 2021-02-05 DIAGNOSIS — Z711 Person with feared health complaint in whom no diagnosis is made: Secondary | ICD-10-CM | POA: Diagnosis not present

## 2021-02-05 NOTE — ED Triage Notes (Addendum)
Patient states that he was moving his outdoor carpet when he felt a scratch from a black widow spider on his left hand.  Patient denies any pain.  Patient denies any redness or swelling.

## 2021-02-05 NOTE — ED Provider Notes (Signed)
[facility]    CSN: [id number] Arrival date & time: 02/05/21  1104      History   Chief Complaint Chief Complaint  Patient presents with   Insect Bite    Left hand    HPI Hampton Wixom is a 40 y.o. male.   HPI  40 year old male here for evaluation after spider encounter.  Patient reports that he was removing a rolled up rug from his back porch when he stuck his hand under an came in contact with a black widow spider.  He states he is unsure if it bit him or not he came in to be checked.  Patient states that he is not having any pain, did not feel a bite, and there is no redness or swelling to his hand where he came in contact with the spider.  Past Medical History:  Diagnosis Date   ADHD (attention deficit hyperactivity disorder)     There are no problems to display for this patient.   Past Surgical History:  Procedure Laterality Date   dental implant         Home Medications    Prior to Admission medications   Medication Sig Start Date End Date Taking? Authorizing Provider  amphetamine-dextroamphetamine (ADDERALL) 15 MG tablet Take 15 mg by mouth 2 (two) times daily.    [provider]  azithromycin (ZITHROMAX Z-PAK) 250 MG tablet Use as per package instructions 02/16/17   Lutricia Feil, PA-C  benzonatate (TESSALON) 200 MG capsule Take one cap TID PRN cough 02/16/17   Lutricia Feil, PA-C  chlorpheniramine-HYDROcodone (TUSSIONEX PENNKINETIC ER) 10-8 MG/5ML SUER Take 5 mLs by mouth 2 (two) times daily. 02/16/17   Lutricia Feil, PA-C  doxycycline (VIBRAMYCIN) 100 MG capsule Take 1 capsule (100 mg total) by mouth 2 (two) times daily. 12/12/19   Moshe Cipro, NP  finasteride (PROPECIA) 1 MG tablet Take 1 mg by mouth daily.    [provider]  QUEtiapine (SEROQUEL) 50 MG tablet Take 50 mg by mouth at bedtime.    [provider]  triamcinolone cream (KENALOG) 0.1 % Apply 1 application topically 2 (two) times daily.  12/12/19   Moshe Cipro, NP    Family History Family History  Problem Relation Age of Onset   Healthy Mother    Heart disease Father     Social History Social History   Tobacco Use   Smoking status: Former   Smokeless tobacco: Current  Building services engineer Use: Never used  Substance Use Topics   Alcohol use: Yes   Drug use: No     Allergies   Penicillins   Review of Systems Review of Systems  Constitutional:  Negative for activity change, appetite change and fever.  HENT:  Negative for congestion and rhinorrhea.   Respiratory:  Negative for shortness of breath and wheezing.   Musculoskeletal:  Negative for arthralgias and myalgias.  Skin:  Negative for color change, rash and wound.  Hematological: Negative.   Psychiatric/Behavioral: Negative.      Physical Exam Triage Vital Signs ED Triage Vitals  Enc Vitals Group     BP 02/05/21 1117 110/88     Pulse Rate 02/05/21 1117 66     Resp 02/05/21 1117 16     Temp 02/05/21 1117 98.1 F (36.7 C)     Temp Source 02/05/21 1117 Oral     SpO2 02/05/21 1117 97 %     Weight 02/05/21 1113 235 lb (106.6 kg)  Height 02/05/21 1113 6\' 1"  (1.854 m)     Head Circumference --      Peak Flow --      Pain Score 02/05/21 1113 0     Pain Loc --      Pain Edu? --      Excl. in GC? --    No data found.  Updated Vital Signs BP 110/88 (BP Location: Left Arm)   Pulse 66   Temp 98.1 F (36.7 C) (Oral)   Resp 16   Ht 6\' 1"  (1.854 m)   Wt 235 lb (106.6 kg)   SpO2 97%   BMI 31.00 kg/m   Visual Acuity Right Eye Distance:   Left Eye Distance:   Bilateral Distance:    Right Eye Near:   Left Eye Near:    Bilateral Near:     Physical Exam Vitals and nursing note reviewed.  Constitutional:      General: He is not in acute distress.    Appearance: Normal appearance. He is not ill-appearing.  HENT:     Head: Normocephalic and atraumatic.  Musculoskeletal:        General: No swelling, tenderness or deformity.   Skin:    General: Skin is warm and dry.     Capillary Refill: Capillary refill takes less than 2 seconds.     Findings: No bruising, erythema, lesion or rash.  Neurological:     General: No focal deficit present.     Mental Status: He is alert and oriented to person, place, and time.  Psychiatric:        Mood and Affect: Mood normal.        Behavior: Behavior normal.        Thought Content: Thought content normal.        Judgment: Judgment normal.     UC Treatments / Results  Labs (all labs ordered are listed, but only abnormal results are displayed) Labs Reviewed - No data to display  EKG   Radiology No results found.  Procedures Procedures (including critical care time)  Medications Ordered in UC Medications - No data to display  Initial Impression / Assessment and Plan / UC Course  I have reviewed the triage vital signs and the nursing notes.  Pertinent labs & imaging results that were available during my care of the patient were reviewed by me and considered in my medical decision making (see chart for details).  Patient is a very pleasant, nontoxic-appearing 89-year-old male who is here because he is concerned after coming in contact with a black widow spider on his porch this morning.  He states he was lifting up a rug and felt a "scratch" from a black widow but he denies any pain afterwards.  There is no swelling or erythema on his palm where he came in contact with the spider.  Patient is in no acute distress at all.  Physical exam reveals no signs of envenomation on his palm.  I discussed with the patient that black widow venom is neurotoxic and is extremely painful when a person is bitten.  Given the absence of erythema, ecchymosis, edema, or marks of envenomation on his palm that his likelihood of being bit is close to 0.  This coupled with his pain-free state certainly points to a lack of envenomation.  Patient advised to avoid any pipes or rolled up objects on the  ground if he knows there are black widow spiders present, and to wear gloves if he is  handling them to prevent further encounters.   Final Clinical Impressions(s) / UC Diagnoses   Final diagnoses:  Worried well   Discharge Instructions   None    ED Prescriptions   None    PDMP not reviewed this encounter.   Becky Augusta, NP 02/05/21 1140

## 2022-05-17 DIAGNOSIS — J069 Acute upper respiratory infection, unspecified: Secondary | ICD-10-CM | POA: Diagnosis not present

## 2022-11-23 ENCOUNTER — Encounter: Payer: Self-pay | Admitting: Family Medicine

## 2022-11-23 ENCOUNTER — Ambulatory Visit (INDEPENDENT_AMBULATORY_CARE_PROVIDER_SITE_OTHER): Payer: No Typology Code available for payment source | Admitting: Family Medicine

## 2022-11-23 VITALS — BP 122/78 | HR 80 | Ht 73.0 in | Wt 230.0 lb

## 2022-11-23 DIAGNOSIS — L700 Acne vulgaris: Secondary | ICD-10-CM | POA: Diagnosis not present

## 2022-11-23 DIAGNOSIS — K2100 Gastro-esophageal reflux disease with esophagitis, without bleeding: Secondary | ICD-10-CM

## 2022-11-23 DIAGNOSIS — K644 Residual hemorrhoidal skin tags: Secondary | ICD-10-CM

## 2022-11-23 DIAGNOSIS — F32A Depression, unspecified: Secondary | ICD-10-CM

## 2022-11-23 DIAGNOSIS — F418 Other specified anxiety disorders: Secondary | ICD-10-CM | POA: Insufficient documentation

## 2022-11-23 DIAGNOSIS — M222X1 Patellofemoral disorders, right knee: Secondary | ICD-10-CM

## 2022-11-23 DIAGNOSIS — K219 Gastro-esophageal reflux disease without esophagitis: Secondary | ICD-10-CM | POA: Insufficient documentation

## 2022-11-23 DIAGNOSIS — M542 Cervicalgia: Secondary | ICD-10-CM

## 2022-11-23 DIAGNOSIS — M222X2 Patellofemoral disorders, left knee: Secondary | ICD-10-CM

## 2022-11-23 DIAGNOSIS — F5104 Psychophysiologic insomnia: Secondary | ICD-10-CM

## 2022-11-23 DIAGNOSIS — G8929 Other chronic pain: Secondary | ICD-10-CM

## 2022-11-23 MED ORDER — MELOXICAM 15 MG PO TABS: 15.0000 mg | ORAL_TABLET | Freq: Every day | ORAL | 0 refills | Status: DC

## 2022-11-23 MED ORDER — TRAZODONE HCL 50 MG PO TABS: 25.0000 mg | ORAL_TABLET | Freq: Every evening | ORAL | 0 refills | Status: DC | PRN

## 2022-11-23 NOTE — Patient Instructions (Addendum)
-   Obtain x-rays - Start meloxicam, take daily with food (no other NSAIDs on this medication such as ibuprofen and/or naproxen) - Can take Tylenol (acetaminophen) for breakthrough pain - Start home exercises for the neck and knees with the information below - Return for follow-up as scheduled  NECK   KNEES

## 2022-11-23 NOTE — Assessment & Plan Note (Addendum)
Chronic, describes significant lifting/straining, has regular bowel movements daily.  In addition to patient oriented information, referral to GI has been placed

## 2022-11-23 NOTE — Progress Notes (Signed)
Primary Care / Sports Medicine Office Visit  Patient Information:  Patient ID: Brett Hunt, male DOB: 05/04/1981 Age: 42 y.o. MRN: [id number]   Brett Hunt is a pleasant 42 y.o. male presenting with the following:  Chief Complaint  Patient presents with   Establish Care   Neck Pain    6 months   Knee Pain    Years, both    Vitals:   11/23/22 0951  BP: 122/78  Pulse: 80  SpO2: 99%   Vitals:   11/23/22 0951  Weight: 230 lb (104.3 kg)  Height: 6\' 1"  (1.854 m)   Body mass index is 30.34 kg/m.  No results found.   Independent interpretation of notes and tests performed by another provider:   None  Procedures performed:   None  Pertinent History, Exam, Impression, and Recommendations:   Brett Hunt was seen today for establish care, neck pain and knee pain.  Depression, unspecified depression type  Gastroesophageal reflux disease with esophagitis without hemorrhage Assessment & Plan: Chronic, mostly controlled with lifestyle and OTC medications, referral to GI has been placed for comorbid external hemorrhoids, would value their input on this issue as well for further optimization.  Orders: -     Ambulatory referral to Gastroenterology  Pustular acne  External hemorrhoids Assessment & Plan: Chronic, describes significant lifting/straining, has regular bowel movements daily.  In addition to patient oriented information, referral to GI has been placed  Orders: -     Ambulatory referral to Gastroenterology  Patellofemoral syndrome, bilateral Assessment & Plan: Chronic, right greater than left in the setting of described prior right patella dislocation.  Pain with flexion activities, ADLs.  Examination shows focality to the patellofemoral articulations bilaterally, right more so than left, dynamic maltracking is noted, additionally right knee with medial tibiofemoral tenderness, no ligamentous laxity, equivocal right knee medial McMurray.  Plan as  follows: - Obtain x-rays bilateral knees - Start meloxicam - Home-based rehab  Orders: -     DG Knee Complete 4 Views Right; Future -     DG Knee Complete 4 Views Left; Future -     Meloxicam; Take 1 tablet (15 mg total) by mouth daily.  Dispense: 30 tablet; Refill: 0  Chronic neck pain Assessment & Plan: Chronic, atraumatic in onset, mostly symptomatic for the past 6 months, denies any change in activity proceeding symptoms, no weakness, no significant radiation.  Involves left superior shoulder, shoulder blade to left neck.  Pain with looking up described.  Examination with limited left cervical spine torsion, maintain sensorimotor bilateral upper extremities, equivocal Spurling's on left, left shoulder exam benign.  Plan as follows: - Dedicated cervical spine x-rays - Meloxicam course - Home-based rehab - Close follow-up  Orders: -     DG Cervical Spine Complete; Future -     Meloxicam; Take 1 tablet (15 mg total) by mouth daily.  Dispense: 30 tablet; Refill: 0  Psychophysiologic insomnia Assessment & Plan: In the setting of comorbid anxiety/depression, has been using OTC sleep aids.  Primarily noting this the night preceding work.  Plan as follows: - Sleep hygiene patient oriented materials provided - Trial of trazodone 25-50 mg nightly as needed - Close follow-up for reevaluation, can consider titration or modification  Orders: -     traZODone HCl; Take 0.5-1 tablets (25-50 mg total) by mouth at bedtime as needed for sleep.  Dispense: 30 tablet; Refill: 0   I provided a total time of 61 minutes including both face-to-face and non-face-to-face time  on 11/23/2022 inclusive of time utilized for medical chart review, information gathering, care coordination with staff, and documentation completion.   Orders & Medications Meds ordered this encounter  Medications   meloxicam (MOBIC) 15 MG tablet    Sig: Take 1 tablet (15 mg total) by mouth daily.    Dispense:  30 tablet     Refill:  0   traZODone (DESYREL) 50 MG tablet    Sig: Take 0.5-1 tablets (25-50 mg total) by mouth at bedtime as needed for sleep.    Dispense:  30 tablet    Refill:  0   Orders Placed This Encounter  Procedures   DG Cervical Spine Complete   DG Knee Complete 4 Views Right   DG Knee Complete 4 Views Left   Ambulatory referral to Gastroenterology     No follow-ups on file.     Jerrol Banana, MD, [facility]   Primary Care Sports Medicine Primary Care and Sports Medicine at [facility]

## 2022-11-23 NOTE — Assessment & Plan Note (Signed)
Chronic, right greater than left in the setting of described prior right patella dislocation.  Pain with flexion activities, ADLs.  Examination shows focality to the patellofemoral articulations bilaterally, right more so than left, dynamic maltracking is noted, additionally right knee with medial tibiofemoral tenderness, no ligamentous laxity, equivocal right knee medial McMurray.  Plan as follows: - Obtain x-rays bilateral knees - Start meloxicam - Home-based rehab

## 2022-11-23 NOTE — Assessment & Plan Note (Signed)
Chronic, atraumatic in onset, mostly symptomatic for the past 6 months, denies any change in activity proceeding symptoms, no weakness, no significant radiation.  Involves left superior shoulder, shoulder blade to left neck.  Pain with looking up described.  Examination with limited left cervical spine torsion, maintain sensorimotor bilateral upper extremities, equivocal Spurling's on left, left shoulder exam benign.  Plan as follows: - Dedicated cervical spine x-rays - Meloxicam course - Home-based rehab - Close follow-up

## 2022-11-23 NOTE — Assessment & Plan Note (Signed)
In the setting of comorbid anxiety/depression, has been using OTC sleep aids.  Primarily noting this the night preceding work.  Plan as follows: - Sleep hygiene patient oriented materials provided - Trial of trazodone 25-50 mg nightly as needed - Close follow-up for reevaluation, can consider titration or modification

## 2022-11-23 NOTE — Assessment & Plan Note (Signed)
Chronic, mostly controlled with lifestyle and OTC medications, referral to GI has been placed for comorbid external hemorrhoids, would value their input on this issue as well for further optimization.

## 2022-12-08 ENCOUNTER — Ambulatory Visit
Admission: RE | Admit: 2022-12-08 | Discharge: 2022-12-08 | Disposition: A | Discharge status: Home / Self Care | Payer: No Typology Code available for payment source | Source: Ambulatory Visit | Attending: Family Medicine | Admitting: Family Medicine

## 2022-12-08 ENCOUNTER — Ambulatory Visit
Admission: RE | Admit: 2022-12-08 | Discharge: 2022-12-08 | Disposition: A | Discharge status: Home / Self Care | Payer: No Typology Code available for payment source | Attending: Family Medicine | Admitting: Family Medicine

## 2022-12-08 ENCOUNTER — Ambulatory Visit (INDEPENDENT_AMBULATORY_CARE_PROVIDER_SITE_OTHER): Payer: No Typology Code available for payment source | Admitting: Family Medicine

## 2022-12-08 ENCOUNTER — Encounter: Payer: Self-pay | Admitting: Family Medicine

## 2022-12-08 VITALS — BP 120/80 | HR 60 | Ht 73.0 in | Wt 232.0 lb

## 2022-12-08 DIAGNOSIS — M542 Cervicalgia: Secondary | ICD-10-CM | POA: Insufficient documentation

## 2022-12-08 DIAGNOSIS — M222X2 Patellofemoral disorders, left knee: Secondary | ICD-10-CM

## 2022-12-08 DIAGNOSIS — L659 Nonscarring hair loss, unspecified: Secondary | ICD-10-CM | POA: Diagnosis not present

## 2022-12-08 DIAGNOSIS — G8929 Other chronic pain: Secondary | ICD-10-CM

## 2022-12-08 DIAGNOSIS — M222X1 Patellofemoral disorders, right knee: Secondary | ICD-10-CM | POA: Insufficient documentation

## 2022-12-08 MED ORDER — MELOXICAM 15 MG PO TABS: 15.0000 mg | ORAL_TABLET | Freq: Every day | ORAL | 0 refills | Status: DC | PRN

## 2022-12-08 MED ORDER — MINOXIDIL 5 % EX FOAM: CUTANEOUS | 11 refills | Status: AC

## 2022-12-08 MED ORDER — CYCLOBENZAPRINE HCL 5 MG PO TABS: 5.0000 mg | ORAL_TABLET | Freq: Three times a day (TID) | ORAL | 0 refills | Status: DC | PRN

## 2022-12-08 NOTE — Progress Notes (Signed)
     Primary Care / Sports Medicine Office Visit  Patient Information:  Patient ID: Brett Hunt, male DOB: 11-27-1980 Age: 42 y.o. MRN: [id number]   Brett Hunt is a pleasant 42 y.o. male presenting with the following:  Chief Complaint  Patient presents with   Knee Pain   Neck Pain    Feeling better    Vitals:   12/08/22 1357  BP: 120/80  Pulse: 60  SpO2: 99%   Vitals:   12/08/22 1357  Weight: 232 lb (105.2 kg)  Height: 6\' 1"  (1.854 m)   Body mass index is 30.61 kg/m.     Independent interpretation of notes and tests performed by another provider:   Independent interpretation of cervical spine x-rays demonstrates loss of the expected cervical lordosis, no acute osseous processes identified  Independent interpretation of right knee x-rays demonstrates tricompartmental degenerative changes, no acute osseous processes  Procedures performed:   None  Pertinent History, Exam, Impression, and Recommendations:   Lynette was seen today for knee pain and neck pain.  Alopecia Assessment & Plan: Interested in pharmacotherapy, reviewed options, plan as follows: - Start minoxidil 5% for  Orders: -     Minoxidil; Apply 1/2 capful twice daily  Dispense: 60 g; Refill: 11  Chronic neck pain Assessment & Plan: Recent x-rays demonstrate significant cervical spasm of the paraspinal musculature, no radicular features noted.  Plan as follows: - Transition to as needed meloxicam - Initiate cyclobenzaprine - Continue home-based rehab - Follow-up as needed, persistent symptoms to be addressed with advanced imaging, formal PT consideration  Orders: -     Meloxicam; Take 1 tablet (15 mg total) by mouth daily as needed for pain.  Dispense: 90 tablet; Refill: 0 -     Cyclobenzaprine HCl; Take 1-2 tablets (5-10 mg total) by mouth 3 (three) times daily as needed for muscle spasms.  Dispense: 90 tablet; Refill: 0  Patellofemoral syndrome, bilateral Assessment & Plan: Patient's  symptoms have improved with scale, x-rays demonstrate tricompartmental involvement, clinically he has focality to the patellofemoral articulations bilaterally.  -Transition to as needed meloxicam - Home-based rehab next-follow-up as needed, persistent symptoms can be addressed with consideration of intra-articular injections, viscosupplementation  Orders: -     Meloxicam; Take 1 tablet (15 mg total) by mouth daily as needed for pain.  Dispense: 90 tablet; Refill: 0     Orders & Medications Meds ordered this encounter  Medications   Minoxidil 5 % FOAM    Sig: Apply 1/2 capful twice daily    Dispense:  60 g    Refill:  11   meloxicam (MOBIC) 15 MG tablet    Sig: Take 1 tablet (15 mg total) by mouth daily as needed for pain.    Dispense:  90 tablet    Refill:  0   cyclobenzaprine (FLEXERIL) 5 MG tablet    Sig: Take 1-2 tablets (5-10 mg total) by mouth 3 (three) times daily as needed for muscle spasms.    Dispense:  90 tablet    Refill:  0   No orders of the defined types were placed in this encounter.    No follow-ups on file.     Jerrol Banana, MD, [facility]   Primary Care Sports Medicine Primary Care and Sports Medicine at [facility]

## 2022-12-20 ENCOUNTER — Other Ambulatory Visit: Payer: Self-pay | Admitting: Family Medicine

## 2022-12-20 DIAGNOSIS — M222X1 Patellofemoral disorders, right knee: Secondary | ICD-10-CM

## 2022-12-20 DIAGNOSIS — G8929 Other chronic pain: Secondary | ICD-10-CM

## 2022-12-21 ENCOUNTER — Encounter: Payer: Self-pay | Admitting: Family Medicine

## 2022-12-22 DIAGNOSIS — L659 Nonscarring hair loss, unspecified: Secondary | ICD-10-CM | POA: Insufficient documentation

## 2022-12-22 NOTE — Progress Notes (Incomplete)
Primary Care / Sports Medicine Office Visit  Patient Information:  Patient ID: Brett Hunt, male DOB: May 24, 1981 Age: 42 y.o. MRN: [id number]   Brett Hunt is a pleasant 42 y.o. male presenting with the following:  Chief Complaint  Patient presents with  . Knee Pain  . Neck Pain    Feeling better    Vitals:   12/08/22 1357  BP: 120/80  Pulse: 60  SpO2: 99%   Vitals:   12/08/22 1357  Weight: 232 lb (105.2 kg)  Height: 6\' 1"  (1.854 m)   Body mass index is 30.61 kg/m.  DG Knee Complete 4 Views Left  Result Date: 12/14/2022 CLINICAL DATA:  Chronic left knee pain, initial encounter EXAM: LEFT KNEE - COMPLETE 4+ VIEW COMPARISON:  None Available. FINDINGS: Minimal degenerative changes in the patellofemoral space laterally are seen. No joint effusion is seen. No bony fracture is noted. IMPRESSION: Mild degenerative change. Electronically Signed   By: Alcide Clever M.D.   On: 12/14/2022 22:47   DG Knee Complete 4 Views Right  Result Date: 12/14/2022 CLINICAL DATA:  Chronic right knee pain, initial encounter EXAM: RIGHT KNEE - COMPLETE 4+ VIEW COMPARISON:  None Available. FINDINGS: Mild degenerative changes are noted in all 3 joint spaces. No acute fracture or dislocation is seen. No soft tissue abnormality is noted. IMPRESSION: Mild degenerative change without acute abnormality. Electronically Signed   By: Alcide Clever M.D.   On: 12/14/2022 22:46   DG Cervical Spine Complete  Result Date: 12/14/2022 CLINICAL DATA:  Chronic neck pain with left-sided radicular symptoms, initial encounter EXAM: CERVICAL SPINE - COMPLETE 4+ VIEW COMPARISON:  02/25/2016 FINDINGS: Seven cervical segments are well visualized. Mild straightening of the normal cervical lordosis is again seen. No prevertebral soft tissue abnormality is noted. The neural foramina are widely patent bilaterally. The odontoid is within normal limits. No soft tissue changes are seen. IMPRESSION: No acute abnormality noted.  Overall  appearance is stable. Electronically Signed   By: Alcide Clever M.D.   On: 12/14/2022 22:45     Independent interpretation of notes and tests performed by another provider:   Independent interpretation of  Procedures performed:   None  Pertinent History, Exam, Impression, and Recommendations:   [name] was seen today for knee pain and neck pain.  Alopecia Assessment & Plan: Interested in pharmacotherapy, reviewed options, plan as follows: - Start minoxidil 5% for  Orders: -     Minoxidil; Apply 1/2 capful twice daily  Dispense: 60 g; Refill: 11  Chronic neck pain -     Meloxicam; Take 1 tablet (15 mg total) by mouth daily as needed for pain.  Dispense: 90 tablet; Refill: 0 -     Cyclobenzaprine HCl; Take 1-2 tablets (5-10 mg total) by mouth 3 (three) times daily as needed for muscle spasms.  Dispense: 90 tablet; Refill: 0  Patellofemoral syndrome, bilateral -     Meloxicam; Take 1 tablet (15 mg total) by mouth daily as needed for pain.  Dispense: 90 tablet; Refill: 0     Orders & Medications Meds ordered this encounter  Medications  . Minoxidil 5 % FOAM    Sig: Apply 1/2 capful twice daily    Dispense:  60 g    Refill:  11  . meloxicam (MOBIC) 15 MG tablet    Sig: Take 1 tablet (15 mg total) by mouth daily as needed for pain.    Dispense:  90 tablet    Refill:  0  .  cyclobenzaprine (FLEXERIL) 5 MG tablet    Sig: Take 1-2 tablets (5-10 mg total) by mouth 3 (three) times daily as needed for muscle spasms.    Dispense:  90 tablet    Refill:  0   No orders of the defined types were placed in this encounter.    No follow-ups on file.     Jerrol Banana, MD, [place]   Primary Care Sports Medicine Primary Care and Sports Medicine at [facility]

## 2022-12-22 NOTE — Assessment & Plan Note (Signed)
Recent x-rays demonstrate significant cervical spasm of the paraspinal musculature, no radicular features noted.  Plan as follows: - Transition to as needed meloxicam - Initiate cyclobenzaprine - Continue home-based rehab - Follow-up as needed, persistent symptoms to be addressed with advanced imaging, formal PT consideration

## 2022-12-22 NOTE — Assessment & Plan Note (Signed)
Patient's symptoms have improved with scale, x-rays demonstrate tricompartmental involvement, clinically he has focality to the patellofemoral articulations bilaterally.  -Transition to as needed meloxicam - Home-based rehab next-follow-up as needed, persistent symptoms can be addressed with consideration of intra-articular injections, viscosupplementation

## 2022-12-22 NOTE — Assessment & Plan Note (Signed)
Interested in pharmacotherapy, reviewed options, plan as follows: - Start minoxidil 5% for

## 2023-01-08 ENCOUNTER — Encounter: Payer: Self-pay | Admitting: Family Medicine

## 2023-01-09 NOTE — Telephone Encounter (Signed)
Please advise meds not on list

## 2023-01-10 ENCOUNTER — Other Ambulatory Visit: Payer: Self-pay | Admitting: Family Medicine

## 2023-01-10 DIAGNOSIS — F32A Depression, unspecified: Secondary | ICD-10-CM

## 2023-01-10 MED ORDER — AUVELITY 45-105 MG PO TBCR: 1.0000 | EXTENDED_RELEASE_TABLET | Freq: Two times a day (BID) | ORAL | 0 refills | Status: DC

## 2023-01-10 NOTE — Telephone Encounter (Signed)
fyi

## 2023-01-17 ENCOUNTER — Ambulatory Visit: Payer: Self-pay | Admitting: *Deleted

## 2023-01-17 NOTE — Telephone Encounter (Signed)
    Chief Complaint: Panic attack 01/15/23, woke pt. Up from sleep. SOB, dizzy, "lasted all night." Still feeling anxious, not sleeping well. Increased Auvelity as instructed. Symptoms: Above, still feeling anxious  Frequency: 2 days ago Pertinent Negatives: Patient denies any thoughts of self harm. Disposition: [] ED /[] Urgent Care (no appt availability in office) / [x] Appointment(In office/virtual)/ []  [facility]/ [] Home Care/ [] Refused Recommended Disposition /[] [facility] [facility]/ []  Follow-up with PCP Additional Notes: Instructed to call back or go to ED for worsening of symptoms. Verbalizes understanding.  Reason for Disposition  Patient sounds very upset or troubled to the triager  Answer Assessment - Initial Assessment Questions 1. CONCERN: "Did anything happen that prompted you to call today?"      Anxiety 2. ANXIETY SYMPTOMS: "Can you describe how you (your loved one; patient) have been feeling?" (e.g., tense, restless, panicky, anxious, keyed up, overwhelmed, sense of impending doom).      Panic 3. ONSET: "How long have you been feeling this way?" (e.g., hours, days, weeks)     01/15/23 4. SEVERITY: "How would you rate the level of anxiety?" (e.g., 0 - 10; or mild, moderate, severe).     Moderate-severe 5. FUNCTIONAL IMPAIRMENT: "How have these feelings affected your ability to do daily activities?" "Have you had more difficulty than usual doing your normal daily activities?" (e.g., getting better, same, worse; self-care, school, work, interactions)     Yes -dizzy, SOB, Lasted all night 6. HISTORY: "Have you felt this way before?" "Have you ever been diagnosed with an anxiety problem in the past?" (e.g., generalized anxiety disorder, panic attacks, PTSD). If Yes, ask: "How was this problem treated?" (e.g., medicines, counseling, etc.)     Yes, but worse 7. RISK OF HARM - SUICIDAL IDEATION: "Do you ever have thoughts of hurting or killing yourself?" If Yes, ask:  "Do  you have these feelings now?" "Do you have a plan on how you would do this?"     No 8. TREATMENT:  "What has been done so far to treat this anxiety?" (e.g., medicines, relaxation strategies). "What has helped?"     Medication 9. TREATMENT - THERAPIST: "Do you have a counselor or therapist? Name?"     No 10. POTENTIAL TRIGGERS: "Do you drink caffeinated beverages (e.g., coffee, colas, teas), and how much daily?" "Do you drink alcohol or use any drugs?" "Have you started any new medicines recently?"       No 11. PATIENT SUPPORT: "Who is with you now?" "Who do you live with?" "Do you have family or friends who you can talk to?"        Wife 12. OTHER SYMPTOMS: "Do you have any other symptoms?" (e.g., feeling depressed, trouble concentrating, trouble sleeping, trouble breathing, palpitations or fast heartbeat, chest pain, sweating, nausea, or diarrhea)       Trouble sleeping 13. PREGNANCY: "Is there any chance you are pregnant?" "When was your last menstrual period?"       N/a  Protocols used: Anxiety and Panic Attack-A-AH

## 2023-01-17 NOTE — Telephone Encounter (Signed)
Pt's wife calling, not on DPR. States pt is asleep and she does not want to wake him. States "Panic attacks" last night and Tuesday night. "Sweating and felt like he was going to die." Advised NT would need to speak with him, states she will have him CB. I did call practice for consult, Marylene Land, as no availability today with Dr. Ashley Royalty. States she will send to other providers for review.   Triage incomplete

## 2023-01-17 NOTE — Telephone Encounter (Signed)
Called and left VM asking patient to call back to discuss panic attacks and possibly get in to see Dr Ashley Royalty sooner. Waiting for call back.  - Brett Hunt

## 2023-01-17 NOTE — Telephone Encounter (Signed)
2nd attempt. - Called patient and left VM to call back to make appt with Dr Judithann Graves sooner to discuss panic attacks.

## 2023-01-18 ENCOUNTER — Telehealth (INDEPENDENT_AMBULATORY_CARE_PROVIDER_SITE_OTHER): Payer: Self-pay | Admitting: Family Medicine

## 2023-01-18 ENCOUNTER — Encounter: Payer: Self-pay | Admitting: Family Medicine

## 2023-01-18 VITALS — Ht 73.0 in

## 2023-01-18 DIAGNOSIS — R410 Disorientation, unspecified: Secondary | ICD-10-CM | POA: Diagnosis not present

## 2023-01-18 DIAGNOSIS — R29818 Other symptoms and signs involving the nervous system: Secondary | ICD-10-CM | POA: Diagnosis not present

## 2023-01-18 DIAGNOSIS — R519 Headache, unspecified: Secondary | ICD-10-CM | POA: Diagnosis not present

## 2023-01-18 DIAGNOSIS — F410 Panic disorder [episodic paroxysmal anxiety] without agoraphobia: Secondary | ICD-10-CM | POA: Diagnosis not present

## 2023-01-18 DIAGNOSIS — Z87891 Personal history of nicotine dependence: Secondary | ICD-10-CM | POA: Diagnosis not present

## 2023-01-18 DIAGNOSIS — R4189 Other symptoms and signs involving cognitive functions and awareness: Secondary | ICD-10-CM | POA: Diagnosis not present

## 2023-01-18 DIAGNOSIS — Z880 Allergy status to penicillin: Secondary | ICD-10-CM | POA: Diagnosis not present

## 2023-01-18 DIAGNOSIS — F418 Other specified anxiety disorders: Secondary | ICD-10-CM

## 2023-01-18 MED ORDER — HYDROXYZINE PAMOATE 25 MG PO CAPS: 25.0000 mg | ORAL_CAPSULE | Freq: Four times a day (QID) | ORAL | 0 refills | Status: DC | PRN

## 2023-01-18 NOTE — Patient Instructions (Signed)
-   Restart Auvelity 1 tablet x 3-7 days - Following 3-7 days, increase to Auvelity 1 tablet twice daily - Start previously prescribed trazodone, 1/2-1 tablet nightly on an as-needed basis for difficulty sleeping - Can use hydroxyzine/Vistaril for breakthrough anxiety attacks symptoms - Allow for adequate rest and hydration for upper respiratory symptoms/possible COVID - For any severe symptoms that fail to respond to the above regimen, seek urgent medical attention - Maintain follow-up as scheduled next steps contact office for any questions/concerns between now and then

## 2023-01-18 NOTE — Progress Notes (Signed)
Primary Care / Sports Medicine Virtual Visit  Patient Information:  Patient ID: Brett Hunt, male DOB: 09-09-1980 Age: 42 y.o. MRN: [id number]   Brett Hunt is a pleasant 42 y.o. male presenting with the following:  Chief Complaint  Patient presents with   Anxiety    Stopped taking avuelity 5 days ago, want to talk to you.    Review of Systems: No fevers, chills, night sweats, weight loss, chest pain, or shortness of breath.   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   meloxicam (MOBIC) 15 MG tablet Take 1 tablet (15 mg total) by mouth daily as needed for pain.   Minoxidil 5 % FOAM Apply 1/2 capful twice daily   Dextromethorphan-buPROPion ER (AUVELITY) 45-105 MG TBCR Take 1 tablet by mouth in the morning and at bedtime. (Patient not taking: Reported on 01/18/2023)   [DISCONTINUED] cyclobenzaprine (FLEXERIL) 5 MG tablet Take 1-2 tablets (5-10 mg total) by mouth 3 (three) times daily as needed for muscle  spasms. (Patient not taking: Reported on 01/18/2023)   No facility-administered encounter medications on file as of 01/18/2023.   Past Surgical History:  Procedure Laterality Date   dental implant  2019   general anesthesia    Virtual Visit via MyChart Video:   I connected with Brett Hunt on 01/18/23 via MyChart Video and verified that I am speaking with the correct person using appropriate identifiers.   The limitations, risks, security and privacy concerns of performing an evaluation and management service by MyChart Video, including the higher likelihood of inaccurate diagnoses and treatments, and the availability of in person appointments were reviewed. The possible need of an additional face-to-face encounter for complete and high quality delivery of care was discussed. The patient was also made aware that there may be a patient responsible charge related to this service. The patient expressed understanding and wishes to proceed.  Provider location is in medical facility. Patient location is at their home, different from provider location. People involved in care of the patient during this telehealth encounter were myself, my nurse/medical assistant, and my front office/scheduling team member.  Objective findings:   General: Speaking full sentences, no audible heavy breathing. Sounds alert and appropriately interactive. Well-appearing. Face symmetric. Extraocular movements intact. Pupils equal and round. No nasal flaring or accessory muscle use visualized.  Independent interpretation of notes and tests performed by another provider:   None  Pertinent History, Exam, Impression, and Recommendations:   Depression with anxiety Patient presents for concern over recent panic attack described as involving  diaphoresis, paresthesias throughout the scalp, extremities, significant uneasiness.  This is in the setting of being off of Auvelity for several days followed by restart due to pharmacy  issues.  Additionally compounded by recent significant work related stressors.  Of note, recent upper respiratory symptoms with significant other being COVID-positive.  We reviewed patient's overall clinical course from initiation above Auvelity roughly 2 months prior, benefits noted, lack of adverse effects reported, and recent increased life stressors coupled with brief medication pause.  Findings most consistent with anxiety attack in the setting of known depression.  Plan as follows: - Restart Auvelity 1 tablet x 3-7 days - Following 3-7 days, increase to Auvelity 1 tablet twice daily - Start previously prescribed trazodone, 1/2-1 tablet nightly on an as-needed basis for difficulty sleeping - Can use hydroxyzine/Vistaril for breakthrough anxiety attacks symptoms - Allow for adequate rest and hydration for upper respiratory symptoms/possible COVID - For any severe symptoms that fail to respond to the above regimen, seek urgent medical attention - Maintain follow-up as scheduled next steps contact office for any questions/concerns between now and then - For suboptimal progress, can consider adjuvant pharmacotherapy with SSRI  Orders & Medications Meds ordered this encounter  Medications   hydrOXYzine (VISTARIL) 25 MG capsule    Sig: Take 1 capsule (25 mg total) by mouth every 6 (six) hours as needed for anxiety.    Dispense:  30 capsule    Refill:  0   No orders of the defined types were placed in this encounter.    I discussed the above assessment and treatment plan with the patient. The patient was provided an opportunity to ask questions and all were answered. The patient agreed with the plan and demonstrated an understanding of the instructions.   The patient was advised to call back or seek an in-person evaluation if the symptoms worsen or if the condition fails to improve as anticipated.   I provided a total time of 30 minutes including both face-to-face and non-face-to-face  time on 01/18/2023 inclusive of time utilized for medical chart review, information gathering, care coordination with staff, and documentation completion.    Jerrol Banana, MD, [facility]   Primary Care Sports Medicine Primary Care and Sports Medicine at [facility]

## 2023-01-18 NOTE — Assessment & Plan Note (Signed)
Patient presents for concern over recent panic attack described as involving diaphoresis, paresthesias throughout the scalp, extremities, significant uneasiness.  This is in the setting of being off of Auvelity for several days followed by restart due to pharmacy issues.  Additionally compounded by recent significant work related stressors.  Of note, recent upper respiratory symptoms with significant other being COVID-positive.  We reviewed patient's overall clinical course from initiation above Auvelity roughly 2 months prior, benefits noted, lack of adverse effects reported, and recent increased life stressors coupled with brief medication pause.  Findings most consistent with anxiety attack in the setting of known depression.  Plan as follows: - Restart Auvelity 1 tablet x 3-7 days - Following 3-7 days, increase to Auvelity 1 tablet twice daily - Start previously prescribed trazodone, 1/2-1 tablet nightly on an as-needed basis for difficulty sleeping - Can use hydroxyzine/Vistaril for breakthrough anxiety attacks symptoms - Allow for adequate rest and hydration for upper respiratory symptoms/possible COVID - For any severe symptoms that fail to respond to the above regimen, seek urgent medical attention - Maintain follow-up as scheduled next steps contact office for any questions/concerns between now and then - For suboptimal progress, can consider adjuvant pharmacotherapy with SSRI

## 2023-01-19 ENCOUNTER — Ambulatory Visit: Payer: Self-pay

## 2023-01-19 DIAGNOSIS — R519 Headache, unspecified: Secondary | ICD-10-CM | POA: Diagnosis not present

## 2023-01-22 ENCOUNTER — Telehealth: Payer: Self-pay

## 2023-01-22 NOTE — Transitions of Care (Post Inpatient/ED Visit) (Unsigned)
   01/22/2023  Name: Brett Hunt MRN: [id number] DOB: June 03, 1981  Today's TOC FU Call Status: Today's TOC FU Call Status:: Unsuccessful Call (1st Attempt) Unsuccessful Call (1st Attempt) Date: 01/22/23  Attempted to reach the patient regarding the most recent Inpatient/ED visit.  Follow Up Plan: Additional outreach attempts will be made to reach the patient to complete the Transitions of Care (Post Inpatient/ED visit) call.   Signature   Woodfin Ganja LPN [facility] Nurse Health Advisor Direct Dial [phone]

## 2023-01-23 ENCOUNTER — Telehealth: Payer: Self-pay

## 2023-01-23 NOTE — Transitions of Care (Post Inpatient/ED Visit) (Signed)
   01/23/2023  Name: Brett Hunt MRN: [id number] DOB: [birth year]  Today's TOC FU Call Status: Today's TOC FU Call Status:: Successful TOC FU Call Completed TOC FU Call Complete Date: 01/23/23  Transition Care Management Follow-up Telephone Call Date of Discharge: 01/19/23 Discharge Facility: Other ([facility]) Name of Other ([facility]) Discharge Facility: [facility] Type of Discharge: Emergency Department Reason for ED Visit: Other: How have you been since you were released from the hospital?: Better Any questions or concerns?: No  Items Reviewed: Did you receive and understand the discharge instructions provided?: Yes Medications obtained,verified, and reconciled?: Yes (Medications Reviewed) Any new allergies since your discharge?: No Dietary orders reviewed?: No Do you have support at home?: No  Medications Reviewed Today: Medications Reviewed Today   Medications were not reviewed in this encounter     Home Care and Equipment/Supplies: Were Home Health Services Ordered?: No Any new equipment or medical supplies ordered?: No  Functional Questionnaire: Do you need assistance with bathing/showering or dressing?: No Do you need assistance with meal preparation?: No Do you need assistance with eating?: No Do you have difficulty maintaining continence: No Do you need assistance with getting out of bed/getting out of a chair/moving?: No Do you have difficulty managing or taking your medications?: No  Follow up appointments reviewed: PCP Follow-up appointment confirmed?: NA Specialist Hospital Follow-up appointment confirmed?: NA Do you need transportation to your follow-up appointment?: No Do you understand care options if your condition(s) worsen?: Yes-patient verbalized understanding    SIGNATURE

## 2023-01-24 NOTE — Progress Notes (Signed)
Celso Amy, PA-C [address]  [address]  [place], [place] [address]  Main: [phone]  Fax: [phone]   Gastroenterology Consultation  Referring Provider:     Jerrol Banana, MD Primary Care Physician:  Jerrol Banana, MD Primary Gastroenterologist:  Celso Amy, PA-C / Dr. Wyline Mood   Reason for Consultation:     GERD, Hemorrhoid        HPI:   Lamarian Anstey is a 42 y.o. y/o male referred for consultation & management  by Jerrol Banana, MD.    Patient has history of anxiety, depression, and ADHD.  Very anxious today.  Working with his PCP to treat anxiety.  Current GI symptoms: Patient has history of acid reflux and heartburn for several months.  He has been taking OTC antacid and famotidine with some benefit.  He does not take consistent daily treatment.  Has not tried PPI.  Denies dysphagia, weight loss, or alarm symptoms.  He has had hemorrhoids for 10 years.  Recently noticed a swollen lump on the outside of the rectum which has been mildly tender.  He does a lot of lifting with his job.  He denies rectal bleeding.  Occasionally has constipation with hard stools.  Typically has bowel movement every day.  No family history of colon cancer.  No previous GI evaluation.   Past Medical History:  Diagnosis Date   ADHD (attention deficit hyperactivity disorder) 2014   Adderall ineffective   Anxiety    Depression    Environmental allergies    Has been through injection series, managed with OTC intranasal sprayOTC nasa   GERD (gastroesophageal reflux disease)     Past Surgical History:  Procedure Laterality Date   dental implant  2019   general anesthesia    Prior to Admission medications   Medication Sig Start Date End Date Taking? Authorizing Provider  Dextromethorphan-buPROPion ER (AUVELITY) 45-105 MG TBCR Take 1 tablet by mouth in the morning and at bedtime. Patient not taking: Reported on 01/18/2023 01/10/23   Jerrol Banana, MD  hydrOXYzine  (VISTARIL) 25 MG capsule Take 1 capsule (25 mg total) by mouth every 6 (six) hours as needed for anxiety. 01/18/23   Jerrol Banana, MD  meloxicam (MOBIC) 15 MG tablet Take 1 tablet (15 mg total) by mouth daily as needed for pain. 12/08/22   Jerrol Banana, MD  Minoxidil 5 % FOAM Apply 1/2 capful twice daily 12/08/22   Jerrol Banana, MD    Family History  Problem Relation Age of Onset   Healthy Mother    Anxiety disorder Mother    Diabetes Mother 81   Heart disease Father 71 - 67       MI   Alcohol abuse Father    Anxiety disorder Father        disabled secondary to this condition   Depression Father    Diabetes Father 59   Drug abuse Father    Hearing loss Maternal Grandfather    Cancer Paternal Grandmother 63 - 69       hematologic   Diabetes Paternal Grandmother    Stroke Paternal Grandmother    Parkinson's disease Paternal Grandmother 45 - 83     Social History   Tobacco Use   Smoking status: Former    Current packs/day: 0.00    Average packs/day: 0.5 packs/day for 10.0 years (5.0 ttl pk-yrs)    Types: Cigarettes, Cigars    Start date: 06/13/1999  Quit date: 06/12/2009    Years since quitting: 13.6   Smokeless tobacco: Current    Types: Chew   Tobacco comments:    Also used smokeless tobacco for years but now use an alternative  Vaping Use   Vaping status: Never Used  Substance Use Topics   Alcohol use: Yes    Comment: Rare   Drug use: No    Allergies as of 01/25/2023 - Review Complete 01/25/2023  Allergen Reaction Noted   Penicillins Other (See Comments) 12/26/2013    Review of Systems:    All systems reviewed and negative except where noted in HPI.   Physical Exam:  BP 114/74   Pulse 81   Temp 97.8 F (36.6 C)   Ht 6\' 1"  (1.854 m)   Wt 219 lb 4.8 oz (99.5 kg)   BMI 28.93 kg/m  No LMP for male patient.  General:   Alert,  Well-developed, well-nourished, pleasant and cooperative in NAD Lungs:  Respirations even and unlabored.  Clear  throughout to auscultation.   No wheezes, crackles, or rhonchi. No acute distress. Heart:  Regular rate and rhythm; no murmurs, clicks, rubs, or gallops. Abdomen:  Normal bowel sounds.  No bruits.  Soft, and non-distended without masses, hepatosplenomegaly or hernias noted.  No Tenderness.  No guarding or rebound tenderness.    Rectal: 1 large swollen external hemorrhoid which is not tender and not bleeding.  Patient would not allow internal rectal exam. Psych:  Alert and cooperative. Very anxious mood and affect.  Imaging Studies: No results found.  Assessment and Plan:   Jeremaih Tilley is a 42 y.o. y/o male has been referred for   1.  GERD  Start pantoprazole 40 Mg 1 tablet once daily  Start famotidine 20 Mg 1 tablet twice daily  Recommend Lifestyle Modifications to prevent Acid Reflux.  Rec. Avoid coffee, sodas, peppermint, citrus fruits, and spicey foods.  Avoid eating 2-3 hours before bedtime.   2.   Hemorrhoids -1 large external hemorrhoid  Discussed treatment for hemorrhoids at length.  For External Hemorrhoids: Rx Hydrocortisone Cream 2.5%, Apply 2 - 3 times daily to rectum as needed. Warm water sitz bath with epsom salt for flare up of external hemorrhoids. Use OTC Preparation H, Tucks Pads, and Witch Hazel wipes as needed. Discussed referral for Surgery as a last resort if Conservative treatment fails.  3.   Constipation  Discussed constipation treatment  Recommend High Fiber diet with fruits, vegetables, and whole grains. Drink 64 ounces of Fluids Daily. Start Miralax Mix 1 capful in a drink daily.  Follow up in 1 month to reevaluate response to treatment.  Celso Amy, PA-C

## 2023-01-25 ENCOUNTER — Encounter: Payer: Self-pay | Admitting: Physician Assistant

## 2023-01-25 ENCOUNTER — Ambulatory Visit (INDEPENDENT_AMBULATORY_CARE_PROVIDER_SITE_OTHER): Payer: BC Managed Care – PPO | Admitting: Physician Assistant

## 2023-01-25 VITALS — BP 114/74 | HR 81 | Temp 97.8°F | Ht 73.0 in | Wt 219.3 lb

## 2023-01-25 DIAGNOSIS — K644 Residual hemorrhoidal skin tags: Secondary | ICD-10-CM

## 2023-01-25 DIAGNOSIS — K5901 Slow transit constipation: Secondary | ICD-10-CM

## 2023-01-25 DIAGNOSIS — K219 Gastro-esophageal reflux disease without esophagitis: Secondary | ICD-10-CM

## 2023-01-25 DIAGNOSIS — K5900 Constipation, unspecified: Secondary | ICD-10-CM | POA: Diagnosis not present

## 2023-01-25 DIAGNOSIS — K649 Unspecified hemorrhoids: Secondary | ICD-10-CM

## 2023-01-25 MED ORDER — PANTOPRAZOLE SODIUM 40 MG PO TBEC: 40.0000 mg | DELAYED_RELEASE_TABLET | Freq: Every day | ORAL | 3 refills | Status: DC

## 2023-01-25 MED ORDER — POLYETHYLENE GLYCOL 3350 17 GM/SCOOP PO POWD: 1.0000 | Freq: Every day | ORAL | 3 refills | Status: DC

## 2023-01-25 MED ORDER — HYDROCORTISONE (PERIANAL) 2.5 % EX CREA: 1.0000 | TOPICAL_CREAM | Freq: Two times a day (BID) | CUTANEOUS | 1 refills | Status: DC

## 2023-01-31 ENCOUNTER — Ambulatory Visit: Payer: Self-pay | Admitting: Family Medicine

## 2023-01-31 ENCOUNTER — Encounter: Payer: Self-pay | Admitting: Family Medicine

## 2023-01-31 VITALS — BP 120/78 | HR 58 | Ht 73.0 in | Wt 218.0 lb

## 2023-01-31 DIAGNOSIS — Z13220 Encounter for screening for lipoid disorders: Secondary | ICD-10-CM | POA: Diagnosis not present

## 2023-01-31 DIAGNOSIS — F5104 Psychophysiologic insomnia: Secondary | ICD-10-CM | POA: Diagnosis not present

## 2023-01-31 DIAGNOSIS — Z114 Encounter for screening for human immunodeficiency virus [HIV]: Secondary | ICD-10-CM

## 2023-01-31 DIAGNOSIS — Z0000 Encounter for general adult medical examination without abnormal findings: Secondary | ICD-10-CM | POA: Diagnosis not present

## 2023-01-31 DIAGNOSIS — Z1159 Encounter for screening for other viral diseases: Secondary | ICD-10-CM | POA: Diagnosis not present

## 2023-01-31 DIAGNOSIS — Z125 Encounter for screening for malignant neoplasm of prostate: Secondary | ICD-10-CM | POA: Diagnosis not present

## 2023-01-31 DIAGNOSIS — F418 Other specified anxiety disorders: Secondary | ICD-10-CM | POA: Diagnosis not present

## 2023-01-31 DIAGNOSIS — E559 Vitamin D deficiency, unspecified: Secondary | ICD-10-CM | POA: Diagnosis not present

## 2023-01-31 MED ORDER — TRAZODONE HCL 50 MG PO TABS: 25.0000 mg | ORAL_TABLET | Freq: Every evening | ORAL | 0 refills | Status: DC | PRN

## 2023-01-31 MED ORDER — HYDROXYZINE PAMOATE 25 MG PO CAPS: 25.0000 mg | ORAL_CAPSULE | Freq: Four times a day (QID) | ORAL | 0 refills | Status: AC | PRN

## 2023-01-31 NOTE — Assessment & Plan Note (Addendum)
Revisited recommendation for as needed nighttime dosing of 25-50 mg trazodone.  Anticipate improvement as underlying primary mood related symptoms controlled.

## 2023-01-31 NOTE — Patient Instructions (Addendum)
-   Stop Auvelity - Use hydroxyzine 1-2 capsules (25-50 mg) up to 4 times a day as needed for anxiety, side effect can be drowsiness - Use trazodone 1/2-1 tablet (25-50 mg) nightly on an as needed basis for sleep - Referral coordinator will contact you to schedule visit with psychiatry - Follow-up in 2 weeks  Can review following table of SSRIs to discuss at follow-up

## 2023-01-31 NOTE — Assessment & Plan Note (Signed)
Ongoing symptomatology, did have recent ER visit on 8/9 with reassuring workup for severe headache at presentation.  We extensively reviewed patient's clinical course, prior tolerance of Auvelity, brief hiatus from the same, restart and current symptoms.  Patient's wife present during visit, shared medical decision making with patient and his spouse.  Plan: - Stop Auvelity - Use hydroxyzine 1-2 capsules (25-50 mg) up to 4 times a day as needed for anxiety, side effect can be drowsiness - Use trazodone 1/2-1 tablet (25-50 mg) nightly on an as needed basis for sleep - Referral coordinator will contact you to schedule visit with psychiatry - Follow-up in 2 weeks - Plan to initiate SSRI, table provided to patient for review and research in preparation of follow-up

## 2023-01-31 NOTE — Progress Notes (Signed)
Primary Care / Sports Medicine Office Visit  Patient Information:  Patient ID: Silas Holsapple, male DOB: [birth year] Age: 42 y.o. MRN: [id number]   Jazmine Zech is a pleasant 42 y.o. male presenting with the following:  Chief Complaint  Patient presents with   Hospitalization Follow-up    Vitals:   01/31/23 0811  BP: 120/78  Pulse: (!) 58  SpO2: 98%   Vitals:   01/31/23 0811  Weight: 218 lb (98.9 kg)  Height: 6\' 1"  (1.854 m)   Body mass index is 28.76 kg/m.  No results found.   Independent interpretation of notes and tests performed by another provider:   None  Procedures performed:   None  Pertinent History, Exam, Impression, and Recommendations:   Ajon was seen today for hospitalization follow-up.  Depression with anxiety Assessment & Plan: Ongoing symptomatology, did have recent ER visit on 8/9 with reassuring workup for severe headache at presentation.  We extensively reviewed patient's clinical course, prior tolerance of Auvelity, brief hiatus from the same, restart and current symptoms.  Patient's wife present during visit, shared medical decision making with patient and his spouse.  Plan: - Stop Auvelity - Use hydroxyzine 1-2 capsules (25-50 mg) up to 4 times a day as needed for anxiety, side effect can be drowsiness - Use trazodone 1/2-1 tablet (25-50 mg) nightly on an as needed basis for sleep - Referral coordinator will contact you to schedule visit with psychiatry - Follow-up in 2 weeks - Plan to initiate SSRI, table provided to patient for review and research in preparation of follow-up  Orders: -     hydrOXYzine Pamoate; Take 1-2 capsules (25-50 mg total) by mouth 4 (four) times daily as needed for anxiety.  Dispense: 90 capsule; Refill: 0 -     Ambulatory referral to Psychiatry  Screening for HIV (human immunodeficiency virus) -     HIV Antibody (routine testing w rflx)  Need for hepatitis C screening test -     Hepatitis C  antibody  Screening for lipoid disorders -     Apo A1 + B + Ratio -     Comprehensive metabolic panel  Annual physical exam -     Apo A1 + B + Ratio -     CBC -     Comprehensive metabolic panel -     Hepatitis C antibody -     HIV Antibody (routine testing w rflx) -     Lipid panel -     PSA Total (Reflex To Free) -     TSH -     VITAMIN D 25 Hydroxy (Vit-D Deficiency, Fractures) -     Hemoglobin A1c  Vitamin D deficiency -     VITAMIN D 25 Hydroxy (Vit-D Deficiency, Fractures)  Healthcare maintenance -     Apo A1 + B + Ratio -     CBC -     Comprehensive metabolic panel -     Hepatitis C antibody -     HIV Antibody (routine testing w rflx) -     Lipid panel -     PSA Total (Reflex To Free) -     TSH -     VITAMIN D 25 Hydroxy (Vit-D Deficiency, Fractures) -     Hemoglobin A1c  Screening for prostate cancer -     PSA Total (Reflex To Free)  Psychophysiologic insomnia Assessment & Plan: Revisited recommendation for as needed nighttime dosing of 25-50 mg trazodone.  Anticipate improvement as  underlying primary mood related symptoms controlled.  Orders: -     traZODone HCl; Take 0.5-1 tablets (25-50 mg total) by mouth at bedtime as needed for sleep.  Dispense: 30 tablet; Refill: 0     Orders & Medications Meds ordered this encounter  Medications   hydrOXYzine (VISTARIL) 25 MG capsule    Sig: Take 1-2 capsules (25-50 mg total) by mouth 4 (four) times daily as needed for anxiety.    Dispense:  90 capsule    Refill:  0   traZODone (DESYREL) 50 MG tablet    Sig: Take 0.5-1 tablets (25-50 mg total) by mouth at bedtime as needed for sleep.    Dispense:  30 tablet    Refill:  0   Orders Placed This Encounter  Procedures   Apo A1 + B + Ratio   CBC   Comprehensive metabolic panel   Hepatitis C antibody   HIV Antibody (routine testing w rflx)   Lipid panel   PSA Total (Reflex To Free)   TSH   VITAMIN D 25 Hydroxy (Vit-D Deficiency, Fractures)   Hemoglobin A1c    Ambulatory referral to Psychiatry     No follow-ups on file.     Jerrol Banana, MD, [facility]   Primary Care Sports Medicine Primary Care and Sports Medicine at [facility]

## 2023-02-01 ENCOUNTER — Other Ambulatory Visit: Payer: Self-pay | Admitting: Family Medicine

## 2023-02-01 ENCOUNTER — Encounter: Payer: Self-pay | Admitting: Family Medicine

## 2023-02-01 DIAGNOSIS — E559 Vitamin D deficiency, unspecified: Secondary | ICD-10-CM

## 2023-02-01 LAB — COMPREHENSIVE METABOLIC PANEL
ALT: 20 IU/L (ref 0–44)
AST: 20 IU/L (ref 0–40)
Albumin: 4.5 g/dL (ref 4.1–5.1)
Alkaline Phosphatase: 71 IU/L (ref 44–121)
BUN/Creatinine Ratio: 13 (ref 9–20)
BUN: 14 mg/dL (ref 6–24)
Bilirubin Total: 0.6 mg/dL (ref 0.0–1.2)
CO2: 24 mmol/L (ref 20–29)
Calcium: 9.5 mg/dL (ref 8.7–10.2)
Chloride: 104 mmol/L (ref 96–106)
Creatinine, Ser: 1.12 mg/dL (ref 0.76–1.27)
Globulin, Total: 1.9 g/dL (ref 1.5–4.5)
Glucose: 100 mg/dL — ABNORMAL HIGH (ref 70–99)
Potassium: 4.5 mmol/L (ref 3.5–5.2)
Sodium: 139 mmol/L (ref 134–144)
Total Protein: 6.4 g/dL (ref 6.0–8.5)
eGFR: 84 mL/min/{1.73_m2} (ref >59)

## 2023-02-01 LAB — CBC
Hematocrit: 41.4 % (ref 37.5–51.0)
Hemoglobin: 13.8 g/dL (ref 13.0–17.7)
MCH: 30.7 pg (ref 26.6–33.0)
MCHC: 33.3 g/dL (ref 31.5–35.7)
MCV: 92 fL (ref 79–97)
Platelets: 219 10*3/uL (ref 150–450)
RBC: 4.49 x10E6/uL (ref 4.14–5.80)
RDW: 12.5 % (ref 11.6–15.4)
WBC: 5.9 10*3/uL (ref 3.4–10.8)

## 2023-02-01 LAB — LIPID PANEL
Chol/HDL Ratio: 2.9 ratio (ref 0.0–5.0)
Cholesterol, Total: 157 mg/dL (ref 100–199)
HDL: 55 mg/dL (ref >39)
LDL Chol Calc (NIH): 94 mg/dL (ref 0–99)
Triglycerides: 32 mg/dL (ref 0–149)
VLDL Cholesterol Cal: 8 mg/dL (ref 5–40)

## 2023-02-01 LAB — VITAMIN D 25 HYDROXY (VIT D DEFICIENCY, FRACTURES): Vit D, 25-Hydroxy: 31.5 ng/mL (ref 30.0–100.0)

## 2023-02-01 LAB — HEMOGLOBIN A1C
Est. average glucose Bld gHb Est-mCnc: 120 mg/dL
Hgb A1c MFr Bld: 5.8 % — ABNORMAL HIGH (ref 4.8–5.6)

## 2023-02-01 LAB — PSA TOTAL (REFLEX TO FREE): Prostate Specific Ag, Serum: 0.4 ng/mL (ref 0.0–4.0)

## 2023-02-01 LAB — APO A1 + B + RATIO
Apolipo. B/A-1 Ratio: 0.6 ratio (ref 0.0–0.7)
Apolipoprotein A-1: 127 mg/dL (ref 101–178)
Apolipoprotein B: 81 mg/dL (ref <90)

## 2023-02-01 LAB — HEPATITIS C ANTIBODY: Hep C Virus Ab: NONREACTIVE

## 2023-02-01 LAB — TSH: TSH: 0.51 u[IU]/mL (ref 0.450–4.500)

## 2023-02-01 LAB — HIV ANTIBODY (ROUTINE TESTING W REFLEX): HIV Screen 4th Generation wRfx: NONREACTIVE

## 2023-02-01 MED ORDER — VITAMIN D (ERGOCALCIFEROL) 1.25 MG (50000 UNIT) PO CAPS: 50000.0000 [IU] | ORAL_CAPSULE | ORAL | 0 refills | Status: DC

## 2023-02-13 ENCOUNTER — Other Ambulatory Visit: Payer: Self-pay | Admitting: Family Medicine

## 2023-02-13 DIAGNOSIS — M222X1 Patellofemoral disorders, right knee: Secondary | ICD-10-CM

## 2023-02-13 DIAGNOSIS — G8929 Other chronic pain: Secondary | ICD-10-CM

## 2023-02-14 ENCOUNTER — Encounter: Payer: Self-pay | Admitting: Family Medicine

## 2023-02-14 ENCOUNTER — Telehealth: Payer: BC Managed Care – PPO | Admitting: Family Medicine

## 2023-02-14 DIAGNOSIS — M222X1 Patellofemoral disorders, right knee: Secondary | ICD-10-CM | POA: Diagnosis not present

## 2023-02-14 DIAGNOSIS — F5104 Psychophysiologic insomnia: Secondary | ICD-10-CM

## 2023-02-14 DIAGNOSIS — G8929 Other chronic pain: Secondary | ICD-10-CM

## 2023-02-14 DIAGNOSIS — M542 Cervicalgia: Secondary | ICD-10-CM | POA: Diagnosis not present

## 2023-02-14 DIAGNOSIS — R7303 Prediabetes: Secondary | ICD-10-CM

## 2023-02-14 DIAGNOSIS — F418 Other specified anxiety disorders: Secondary | ICD-10-CM

## 2023-02-14 DIAGNOSIS — M222X2 Patellofemoral disorders, left knee: Secondary | ICD-10-CM

## 2023-02-14 DIAGNOSIS — R5383 Other fatigue: Secondary | ICD-10-CM

## 2023-02-14 MED ORDER — MELOXICAM 15 MG PO TABS: 15.0000 mg | ORAL_TABLET | Freq: Every day | ORAL | 1 refills | Status: AC | PRN

## 2023-02-14 NOTE — Assessment & Plan Note (Signed)
Has tolerated full discontinuation of Auvelity, overall cites improvement.  We reviewed PHQ and GAD scores.  Reported vivid dreams adverse effect with trazodone.  Plan: - Continue to use hydroxyzine, 1-2 capsules (25-50 mg) as needed for anxiety episodes; can also trial hydroxyzine at bedtime for sleep - Contact [facility] [phone] to schedule visit - Can contact us if wanting to start sertraline (SSRI) - Follow-up as needed

## 2023-02-14 NOTE — Progress Notes (Signed)
Primary Care / Sports Medicine Virtual Visit  Patient Information:  Patient ID: Brett Hunt, male DOB: 1981/02/07 Age: 42 y.o. MRN: [id number]   Brett Hunt is a pleasant 42 y.o. male presenting with the following:  Chief Complaint  Patient presents with   Depression with anxiety    Review of Systems: No fevers, chills, night sweats, weight loss, chest pain, or shortness of breath.   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 Take for 8 total doses(weeks)   [DISCONTINUED] meloxicam (MOBIC) 15 MG tablet Take 1 tablet (15 mg total) by mouth daily as needed for pain.   hydrOXYzine (VISTARIL) 25 MG capsule Take 1-2 capsules (25-50 mg total) by mouth 4 (four) times daily as needed for anxiety. (Patient not taking: Reported on 02/14/2023)   meloxicam (MOBIC) 15 MG tablet Take 1 tablet (15 mg total) by mouth daily as needed for pain.   Minoxidil 5 % FOAM Apply 1/2 capful twice daily (Patient  not taking: Reported on 02/14/2023)   [DISCONTINUED] Dextromethorphan-buPROPion ER (AUVELITY) 45-105 MG TBCR Take 1 tablet by mouth in the morning and at bedtime.   [DISCONTINUED] pantoprazole (PROTONIX) 40 MG tablet Take 1 tablet (40 mg total) by mouth daily.   [DISCONTINUED] traZODone (DESYREL) 50 MG tablet Take 0.5-1 tablets (25-50 mg total) by mouth at bedtime as needed for sleep. (Patient not taking: Reported on 02/14/2023)   No facility-administered encounter medications on file as of 02/14/2023.   Past Surgical History:  Procedure Laterality Date   dental implant  2019   general anesthesia    Virtual Visit via MyChart Video:   I connected with Brett Hunt on 02/14/23 via MyChart Video and verified that I am speaking with the correct person using appropriate identifiers.   The limitations, risks, security and privacy concerns of performing an evaluation and management service by MyChart Video, including the higher likelihood of inaccurate diagnoses and treatments, and the availability of in person appointments were reviewed. The possible need of an additional face-to-face encounter for complete and high quality delivery of care was discussed. The patient was also made aware that there may be a patient responsible charge related to this service. The patient expressed understanding and wishes to proceed.  Provider location is in medical facility. Patient location is at their home, different from provider location. People involved in care of the patient during this telehealth encounter were myself, my nurse/medical assistant, and my front office/scheduling team member.  Objective findings:   General: Speaking full sentences, no audible heavy breathing. Sounds  alert and appropriately interactive. Well-appearing. Face symmetric. Extraocular movements intact. Pupils equal and round. No nasal flaring or accessory muscle use visualized.  Independent interpretation of notes and tests performed by  another provider:   None  Pertinent History, Exam, Impression, and Recommendations:   Chronic neck pain Very well-controlled with as needed meloxicam.  Refill sent in today.  Patellofemoral syndrome, bilateral Very well-controlled with as needed meloxicam.  Refill sent in today.  Depression with anxiety Has tolerated full discontinuation of Auvelity, overall cites improvement.  We reviewed PHQ and GAD scores.  Reported vivid dreams adverse effect with trazodone.  Plan: - Continue to use hydroxyzine, 1-2 capsules (25-50 mg) as needed for anxiety episodes; can also trial hydroxyzine at bedtime for sleep - Contact [facility] [phone] to schedule visit - Can contact us if wanting to start sertraline (SSRI) - Follow-up as needed  Psychophysiologic insomnia See additional assessment(s) for plan details.  Prediabetes Recently noted on labs, has already begun lifestyle changes.  Plan: - Continue healthy lifestyle changes - Plan for lab recheck in 3 months, patient will contact us to coordinate  Other fatigue Chronic, ongoing, in the setting of comorbid behavioral health concerns, prediabetes, low vitamin D.  We reviewed multifactorial etiologies and ongoing treatment involving vitamin D supplementation, following with psychiatry, and lifestyle modifications to aid in addressing prediabetes.  Plan: - Patient to contact us in 3 months to coordinate updated labs, plan for vitamin D, recheck A1c, testosterone levels  Orders & Medications Meds ordered this encounter  Medications   meloxicam (MOBIC) 15 MG tablet    Sig: Take 1 tablet (15 mg total) by mouth daily as needed for pain.    Dispense:  90 tablet    Refill:  1   No orders of the defined types were placed in this encounter.    I discussed the above assessment and treatment plan with the patient. The patient was provided an opportunity to ask questions and all were answered. The patient agreed with the  plan and demonstrated an understanding of the instructions.   The patient was advised to call back or seek an in-person evaluation if the symptoms worsen or if the condition fails to improve as anticipated.   I provided a total time of 40 minutes including both face-to-face and non-face-to-face time on 02/14/2023 inclusive of time utilized for medical chart review, information gathering, care coordination with staff, and documentation completion.    Jerrol Banana, MD, [facility]   Primary Care Sports Medicine Primary Care and Sports Medicine at [place]

## 2023-02-14 NOTE — Assessment & Plan Note (Signed)
See additional assessment(s) for plan details. 

## 2023-02-14 NOTE — Patient Instructions (Addendum)
-   Review information attached and make changes where applicable - Continue to use hydroxyzine, 1-2 capsules (25-50 mg) as needed for anxiety episodes; can also trial hydroxyzine at bedtime for sleep - Contact [facility] [phone] to schedule visit - Can contact us if wanting to start sertraline (SSRI) - Contact us in 3 months to coordinate labs and follow-up

## 2023-02-14 NOTE — Assessment & Plan Note (Signed)
Very well-controlled with as needed meloxicam.  Refill sent in today.

## 2023-02-14 NOTE — Assessment & Plan Note (Addendum)
Very well-controlled with as needed meloxicam.  Refill sent in today.

## 2023-02-14 NOTE — Assessment & Plan Note (Signed)
Chronic, ongoing, in the setting of comorbid behavioral health concerns, prediabetes, low vitamin D.  We reviewed multifactorial etiologies and ongoing treatment involving vitamin D supplementation, following with psychiatry, and lifestyle modifications to aid in addressing prediabetes.  Plan: - Patient to contact us in 3 months to coordinate updated labs, plan for vitamin D, recheck A1c, testosterone levels

## 2023-02-14 NOTE — Assessment & Plan Note (Signed)
Recently noted on labs, has already begun lifestyle changes.  Plan: - Continue healthy lifestyle changes - Plan for lab recheck in 3 months, patient will contact us to coordinate

## 2023-03-14 ENCOUNTER — Ambulatory Visit: Payer: Self-pay | Admitting: Physician Assistant

## 2023-03-21 ENCOUNTER — Other Ambulatory Visit: Payer: Self-pay | Admitting: Family Medicine

## 2023-03-21 DIAGNOSIS — L659 Nonscarring hair loss, unspecified: Secondary | ICD-10-CM

## 2023-03-21 NOTE — Telephone Encounter (Signed)
Requested medication (s) are due for refill today- no  Requested medication (s) are on the active medication list -yes  Future visit scheduled -no  Last refill: 12/08/22 60g 11RF  Notes to clinic: off protocol-provider review   Requested Prescriptions  Pending Prescriptions Disp Refills   Minoxidil 5 % FOAM [Pharmacy Med Name: MINOXIDIL WOMENS 5% AER FOAM] 60 g 11    Sig: Apply 1/2 capful twice daily     Off-Protocol Failed - 03/21/2023 10:00 AM      Failed - Medication not assigned to a protocol, review manually.      Passed - Valid encounter within last 12 months    Recent Outpatient Visits           1 month ago Depression with anxiety   [place] [facility], Ocie Bob, MD   1 month ago Depression with anxiety   [place] [facility], Ocie Bob, MD   2 months ago Depression with anxiety   [place] [facility], Ocie Bob, MD   3 months ago Alopecia   [facility] at [facility], Ocie Bob, MD   3 months ago Depression, unspecified depression type   [facility] at [facility], Ocie Bob, MD                 Requested Prescriptions  Pending Prescriptions Disp Refills   Minoxidil 5 % FOAM [Pharmacy Med Name: MINOXIDIL WOMENS 5% AER FOAM] 60 g 11    Sig: Apply 1/2 capful twice daily     Off-Protocol Failed - 03/21/2023 10:00 AM      Failed - Medication not assigned to a protocol, review manually.      Passed - Valid encounter within last 12 months    Recent Outpatient Visits           1 month ago Depression with anxiety   [place] [facility], Ocie Bob, MD   1 month ago Depression with anxiety   [facility] at [facility], Ocie Bob, MD   2  months ago Depression with anxiety   [facility] at [facility], Ocie Bob, MD   3 months ago Alopecia   [facility] at [facility], Ocie Bob, MD   3 months ago Depression, unspecified depression type   [facility] Medicine at [facility], Ocie Bob, MD

## 2023-05-02 ENCOUNTER — Other Ambulatory Visit: Payer: Self-pay | Admitting: Family Medicine

## 2023-05-02 DIAGNOSIS — E559 Vitamin D deficiency, unspecified: Secondary | ICD-10-CM

## 2023-05-03 NOTE — Telephone Encounter (Signed)
Requested medication (s) are due for refill today: yes  Requested medication (s) are on the active medication list: yes  Last refill:  02/01/23 #8  Future visit scheduled: no  Notes to clinic:  med not delegated to NT to reordered   Requested Prescriptions  Pending Prescriptions Disp Refills   Vitamin D, Ergocalciferol, (DRISDOL) 1.25 MG (50000 UNIT) CAPS capsule [Pharmacy Med Name: VITAMIN D2 50,000IU (ERGO) CAP RX] 8 capsule 0    Sig: TAKE 1 CAPSULE BY MOUTH EVERY 7 DAYS. TAKE FOR 8 TOTAL DOSES.     Endocrinology:  Vitamins - Vitamin D Supplementation 2 Failed - 05/02/2023  6:46 AM      Failed - Manual Review: Route requests for 50,000 IU strength to the provider      Passed - Ca in normal range and within 360 days    Calcium  Date Value Ref Range Status  01/31/2023 9.5 8.7 - 10.2 mg/dL Final         Passed - Vitamin D in normal range and within 360 days    Vit D, 25-Hydroxy  Date Value Ref Range Status  01/31/2023 31.5 30.0 - 100.0 ng/mL Final    Comment:    Vitamin D deficiency has been defined by the Institute of Medicine and an Endocrine Society practice guideline as a level of serum 25-OH vitamin D less than 20 ng/mL (1,2). The Endocrine Society went on to further define vitamin D insufficiency as a level between 21 and 29 ng/mL (2). 1. IOM (Institute of Medicine). 2010. Dietary reference    intakes for calcium and D. [place]: The    [employer]. 2. Holick MF, Binkley Saltillo, Bischoff-Ferrari HA, et al.    Evaluation, treatment, and prevention of vitamin D    deficiency: an Endocrine Society clinical practice    guideline. JCEM. 2011 Jul; 96(7):1911-30.          Passed - Valid encounter within last 12 months    Recent Outpatient Visits           2 months ago Depression with anxiety    [facility] at [facility], Ocie Bob, MD   3 months ago Depression with anxiety   [facility] & [facility] at [facility], Ocie Bob, MD   3 months ago Depression with anxiety   [facility] Medicine at [facility], Ocie Bob, MD   4 months ago Alopecia   [facility] Primary Care & [facility] Medicine at [facility], Ocie Bob, MD   5 months ago Depression, unspecified depression type   [facility] Care & Sports Medicine at [facility], Ocie Bob, MD

## 2023-08-02 ENCOUNTER — Ambulatory Visit: Payer: Self-pay | Admitting: Family Medicine

## 2023-08-02 NOTE — Telephone Encounter (Signed)
 Please review. Thank you.  Brett Hunt

## 2023-08-02 NOTE — Telephone Encounter (Signed)
 Chief Complaint: Numbness Symptoms: Tingling Frequency: Intermittent Pertinent Negatives: Patient denies chest pain, palpitations, headache, dizziness, double vision, changes in speech, unsteady on feet Disposition: [x] ED /[] Urgent Care (no appt availability in office) / [] Appointment(In office/virtual)/ []  [facility]/ [] Home Care/ [] Refused Recommended Disposition /[] [facility]/ []  Follow-up with PCP Additional Notes: Pt states he has had intermittent numbness in his face for multiple months. Pt states the location changes but it is mainly in the left back side of his head that moves into his forehead and other areas. Pt states it sometimes tingles like when your hand falls asleep. Pt states it is present now and on the left side. Based off symptoms, pt advised to go to ED. This RN offered to call EMS for pt but pt declined. Pt wife taking pt to ED now. Pt verbalized understanding and agrees to plan.   Copied from CRM [id number]. Topic: Clinical - Red Word Triage >> Aug 02, 2023 12:24 PM Antwanette L wrote: Red Word that prompted transfer to Nurse Triage: Having numbness in head and face for the past month Reason for Disposition  [1] Numbness (i.e., loss of sensation) of the face, arm / hand, or leg / foot on one side of the body AND [2] sudden onset AND [3] present now  Answer Assessment - Initial Assessment Questions 1. SYMPTOM: "What is the main symptom you are concerned about?" (e.g., weakness, numbness)     Numbness 2. ONSET: "When did this start?" (minutes, hours, days; while sleeping)     Not sure how long, multiple months 3. PATTERN "Does this come and go, or has it been constant since it started?"  "Is it present now?"     Comes and goes; there for full day if he does have it 5. CARDIAC SYMPTOMS: "Have you had any of the following symptoms: chest pain, difficulty breathing, palpitations?"     Pt has anxiety so has difficulty breathing at times 6. NEUROLOGIC  SYMPTOMS: "Have you had any of the following symptoms: headache, dizziness, vision loss, double vision, changes in speech, unsteady on your feet?"     Denies 7. OTHER SYMPTOMS: "Do you have any other symptoms?"     Anxiety comes and goes  Protocols used: Neurologic Deficit-A-AH

## 2023-08-03 NOTE — Telephone Encounter (Signed)
 Called pt left VM. VM stated pt needs to go to the ED. Viewed chart I dont see any ED viisit notes. Name stated on VM.  Copied from CRM [id number]. Topic: Clinical - Medical Advice >> Aug 02, 2023  1:13 PM Brett Hunt wrote: Reason for CRM: Patient would like a call back from the doctor about numbness in face and head//patient has spoken with nurse already today and is currently on the way to the Emergency Department//

## 2023-08-10 ENCOUNTER — Ambulatory Visit: Payer: 59 | Admitting: Family Medicine

## 2023-08-10 ENCOUNTER — Encounter: Payer: Self-pay | Admitting: Family Medicine

## 2023-08-10 VITALS — BP 131/84 | HR 87 | Ht 73.0 in | Wt 232.4 lb

## 2023-08-10 DIAGNOSIS — F418 Other specified anxiety disorders: Secondary | ICD-10-CM

## 2023-08-10 DIAGNOSIS — M542 Cervicalgia: Secondary | ICD-10-CM

## 2023-08-10 MED ORDER — METHOCARBAMOL 500 MG PO TABS: 500.0000 mg | ORAL_TABLET | Freq: Three times a day (TID) | ORAL | 0 refills | Status: AC | PRN

## 2023-08-10 NOTE — Assessment & Plan Note (Signed)
 History of Present Illness Brett Hunt is a 43 year old male who presents for follow-up to recent ER visit at [facility] ER on 2/20 with posterolateral left numbness in the head.  He has been experiencing numbness in the back of his head for over six months, primarily on the left side. The numbness often occurs when lying on the couch in a certain position or wearing a tight hat, such as when playing golf. It sometimes spreads to his forehead and around his eyes. No headaches, vision changes, or numbness and tingling beyond the head, except for pressure-related numbness in his limbs when sleeping in certain positions. No speech issues or weakness in the legs.  [facility] HPI from 08/02/2023 as follows: HPI: Brett Hunt is a 43 y.o. male history of anxiety. Presents to the ED today complaining of intermittent left posterior head numbness for the past several months. Numbness seems to primarily originate from this left posterior scalp region, but occasionally can radiate down into his left neck and occasionally across his forehead (bilaterally).   Has also had occasional numbness to his extremities, but states that this typically occurs when he is sitting in one position for a long time or sleeping while laying on one of his extremities. He is less concerned about the the extremity weakness as he feels like it always seems to make sense why he is experiencing it.  No associated slurred speech, aphasia, facial droop, or weakness. Patient does state that he cracks his neck frequently.  While in the ER, he had a CT head and neck angiogram performed which was negative. He was discharged with advice to follow-up here.  Additionally, he mentions a history of neck pain, which he believes has improved with turmeric use. He frequently pops his neck due to pain, which sometimes results in a sharp pain radiating upwards. He mentions positional upper arm paresthesias bilaterally.  Physical Exam NECK: Rightward  torsion elicits right paraspinal discomfort. Left retorsion slightly limited compared to contralateral. Extension elicits central tenderness. Full painless flexion. Full symmetric lateral bend without pain. Negative Spurling test bilaterally. Muscular spasm paraspinal left more so than right upper trapezius.  EXTREMITIES: Sensorimotor function intact bilateral upper extremities.  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 Recent CT angiogram and cervical spine imaging from June 2024 showed no significant abnormalities. Physical exam revealed limited neck rotation to the left side and right paraspinal discomfort with rightward torsion. No signs of active neural impingement. -Start home exercises and as-needed heat therapy. -Prescribe Robaxin 500 mg as needed, up to three times a day. Side effect can be drowsiness, no driving / machinery x 8 hours after dosing.

## 2023-08-10 NOTE — Patient Instructions (Addendum)
 Patient Plan  1. Cervicalgia and Numbness:    - Begin home exercises and use heat therapy as needed. See below.    - Take Robaxin 500 mg up to three times a day as needed. Avoid driving or operating machinery for 8 hours after taking a dose.  2. Anxiety and Possible Bipolar Disorder:    - Continue using Hydroxyzine as needed for anxiety management.    - Referral to psychiatry for further evaluation and management of potential bipolar disorder.  3. Follow-Up and Monitoring:    - Return for physical in September.

## 2023-08-10 NOTE — Progress Notes (Signed)
 Primary Care / Sports Medicine Office Visit  Patient Information:  Patient ID: Brett Hunt, male DOB: 03/18/1981 Age: 43 y.o. MRN: [id number]   Brett Hunt is a pleasant 43 y.o. male presenting with the following:  Chief Complaint  Patient presents with   Numbness    Numbness on posterior aspect of his head. Aggravating factors are wearing hats, head position and then sometimes the numbness comes on by itself. This has been going on for a while and he would like to discuss his concerns about this issue. Would also like to discuss supplements.     Vitals:   08/10/23 1052  BP: 131/84  Pulse: 87  SpO2: 98%   Vitals:   08/10/23 1052  Weight: 232 lb 6.4 oz (105.4 kg)  Height: 6\' 1"  (1.854 m)   Body mass index is 30.66 kg/m.  No results found.   Independent interpretation of notes and tests performed by another provider:   None  Procedures performed:   None  Pertinent History, Exam, Impression, and Recommendations:   Problem List Items Addressed This Visit     Cervicalgia - Primary   History of Present Illness Brett Hunt is a 43 year old male who presents for follow-up to recent ER visit at [facility] on 2/20 with posterolateral left numbness in the head.  He has been experiencing numbness in the back of his head for over six months, primarily on the left side. The numbness often occurs when lying on the couch in a certain position or wearing a tight hat, such as when playing golf. It sometimes spreads to his forehead and around his eyes. No headaches, vision changes, or numbness and tingling beyond the head, except for pressure-related numbness in his limbs when sleeping in certain positions. No speech issues or weakness in the legs.  Brett Hunt is a 43 y.o. male history of anxiety. Presents to the ED today complaining of intermittent left posterior head numbness for the past several months. Numbness seems to  primarily originate from this left posterior scalp region, but occasionally can radiate down into his left neck and occasionally across his forehead (bilaterally).   Has also had occasional numbness to his extremities, but states that this typically occurs when he is sitting in one position for a long time or sleeping while laying on one of his extremities. He is less concerned about the the extremity weakness as he feels like it always seems to make sense why he is experiencing it.  No associated slurred speech, aphasia, facial droop, or weakness. Patient does state that he cracks his neck frequently.  While in the ER, he had a CT head and neck angiogram performed which was negative. He was discharged with advice to follow-up here.  Additionally, he mentions a history of neck pain, which he believes has improved with turmeric use. He frequently pops his neck due to pain, which sometimes results in a sharp pain radiating upwards. He mentions positional upper arm paresthesias bilaterally.  Physical Exam NECK: Rightward torsion elicits right paraspinal discomfort. Left retorsion slightly limited compared to contralateral. Extension elicits central tenderness. Full painless flexion. Full symmetric lateral bend without pain. Negative Spurling test bilaterally. Muscular spasm paraspinal left more so than right upper trapezius.  EXTREMITIES: Sensorimotor function intact bilateral upper extremities.  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 Recent CT angiogram and cervical spine  imaging from June 2024 showed no significant abnormalities. Physical exam revealed limited neck rotation to the left side and right paraspinal discomfort with rightward torsion. No signs of active neural impingement. -Start home exercises and as-needed heat therapy. -Prescribe Robaxin 500 mg as needed, up to three times a day. Side effect can be drowsiness, no driving / machinery x 8 hours after dosing.      Relevant Medications   methocarbamol (ROBAXIN) 500 MG tablet   Depression with anxiety   He experiences anxiety daily, particularly when leaving the house, even for enjoyable activities like playing golf. He uses hydroxyzine to manage his anxiety, taking it at least once or twice a week, sometimes more frequently if needed. He has taken two doses of hydroxyzine on the day of the visit due to anxiety about the appointment. He lost his job in November and has been at home since, which he believes has contributed to his increased anxiety, especially with the prospect of returning to work and interviewing.  1. Have there been at least 6 different periods of time (at least 2 weeks) when you felt deeply depressed? Yes  2. Did you have problems with depression before the age of 56? No  3. Have you ever had to stop or change your antidepressant because it made you highly irritable or hyper? No  4. Have you ever had a period of at least 1 week during which you were more talkative than normal with thoughts racing in your head? Yes  5. Have you ever had a period of at least 1 week during which you felt any of the following: unusually happy; unusually outgoing; or unusually energetic? Yes  6. Have you ever had a period of at least 1 week during which you needed much less sleep than usual? No  "YES" responses to 4 or more of the 6 items is considered a positive screen providing high confidence for BP-I, with an estimated 88% sensitivity, 80% specificity, and 84% accuracy.  "YES" to 3 or more of the 6  items also suggests a higher likelihood of BP-I than MDD with an estimated 97% sensitivity, 59% specificity, and 77% accuracy.  Anxiety/Depression Daily anxiety symptoms, with frequent use of Hydroxyzine. No prior history of depression before the age of 37, but periods of increased talkativeness, racing thoughts, and unusual energy or happiness reported. Possible bipolar disorder suggested by RMS questionnaire. -Continue Hydroxyzine as needed. -Referral to psychiatry for further evaluation and management.      Relevant Orders   Ambulatory referral to Psychiatry   I provided a total time of 47 minutes including both face-to-face and non-face-to-face time on 08/10/2023 inclusive of time utilized for medical chart review, information gathering, care coordination with staff, and documentation completion.   Orders & Medications Medications:  Meds ordered this encounter  Medications   methocarbamol (ROBAXIN) 500 MG tablet    Sig: Take 1 tablet (500 mg total) by mouth every 8 (eight) hours as needed for muscle spasms.    Dispense:  60 tablet    Refill:  0   Orders Placed This Encounter  Procedures   Ambulatory referral to Psychiatry     Return in about 6 months (around 02/11/2024) for CPE.  Jerrol Banana, MD, [facility]   Primary Care Sports Medicine Primary Care and Sports Medicine at [facility]

## 2023-08-10 NOTE — Assessment & Plan Note (Signed)
 He experiences anxiety daily, particularly when leaving the house, even for enjoyable activities like playing golf. He uses hydroxyzine to manage his anxiety, taking it at least once or twice a week, sometimes more frequently if needed. He has taken two doses of hydroxyzine on the day of the visit due to anxiety about the appointment. He lost his job in November and has been at home since, which he believes has contributed to his increased anxiety, especially with the prospect of returning to work and interviewing.  1. Have there been at least 6 different periods of time (at least 2 weeks) when you felt deeply depressed? Yes  2. Did you have problems with depression before the age of 44? No  3. Have you ever had to stop or change your antidepressant because it made you highly irritable or hyper? No  4. Have you ever had a period of at least 1 week during which you were more talkative than normal with thoughts racing in your head? Yes  5. Have you ever had a period of at least 1 week during which you felt any of the following: unusually happy; unusually outgoing; or unusually energetic? Yes  6. Have you ever had a period of at least 1 week during which you needed much less sleep than usual? No  "YES" responses to 4 or more of the 6 items is considered a positive screen providing high confidence for BP-I, with an estimated 88% sensitivity, 80% specificity, and 84% accuracy.  "YES" to 3 or more of the 6 items also suggests a higher likelihood of BP-I than MDD with an estimated 97% sensitivity, 59% specificity, and 77% accuracy.  Anxiety/Depression Daily anxiety symptoms, with frequent use of Hydroxyzine. No prior history of depression before the age of 58, but periods of increased talkativeness, racing thoughts, and unusual energy or happiness reported. Possible bipolar disorder suggested by RMS questionnaire. -Continue Hydroxyzine as needed. -Referral to psychiatry for further evaluation and  management.

## 2024-02-07 ENCOUNTER — Encounter: Payer: 59 | Admitting: Family Medicine
